# Patient Record
Sex: Male | Born: 1985 | Hispanic: Yes | Marital: Single | Smoking: Never smoker
Health system: Southern US, Community
[De-identification: ages and names within clinical notes are randomized; demographics above are authoritative.]

## PROBLEM LIST (undated history)

## (undated) DIAGNOSIS — G8929 Other chronic pain: Secondary | ICD-10-CM

## (undated) DIAGNOSIS — A048 Other specified bacterial intestinal infections: Secondary | ICD-10-CM

## (undated) DIAGNOSIS — M25579 Pain in unspecified ankle and joints of unspecified foot: Secondary | ICD-10-CM

## (undated) DIAGNOSIS — R1013 Epigastric pain: Secondary | ICD-10-CM

## (undated) DIAGNOSIS — K8020 Calculus of gallbladder without cholecystitis without obstruction: Secondary | ICD-10-CM

## (undated) DIAGNOSIS — R1011 Right upper quadrant pain: Secondary | ICD-10-CM

## (undated) DIAGNOSIS — M25562 Pain in left knee: Secondary | ICD-10-CM

## (undated) HISTORY — DX: Right upper quadrant pain: R10.11

## (undated) HISTORY — DX: Pain in unspecified ankle and joints of unspecified foot: M25.579

## (undated) HISTORY — DX: Other chronic pain: G89.29

## (undated) HISTORY — DX: Other specified bacterial intestinal infections: A04.8

## (undated) HISTORY — DX: Calculus of gallbladder without cholecystitis without obstruction: K80.20

## (undated) HISTORY — DX: Epigastric pain: R10.13

## (undated) HISTORY — PX: NO PAST SURGERIES: SHX2092

## (undated) HISTORY — DX: Pain in left knee: M25.562

---

## 2013-02-28 DIAGNOSIS — M25579 Pain in unspecified ankle and joints of unspecified foot: Secondary | ICD-10-CM

## 2013-02-28 HISTORY — DX: Pain in unspecified ankle and joints of unspecified foot: M25.579

## 2013-03-05 DIAGNOSIS — K219 Gastro-esophageal reflux disease without esophagitis: Secondary | ICD-10-CM | POA: Insufficient documentation

## 2013-12-22 DIAGNOSIS — H6123 Impacted cerumen, bilateral: Secondary | ICD-10-CM | POA: Insufficient documentation

## 2014-04-27 DIAGNOSIS — A048 Other specified bacterial intestinal infections: Secondary | ICD-10-CM | POA: Insufficient documentation

## 2014-04-27 HISTORY — DX: Other specified bacterial intestinal infections: A04.8

## 2014-09-07 DIAGNOSIS — R1013 Epigastric pain: Secondary | ICD-10-CM

## 2014-09-07 DIAGNOSIS — G8929 Other chronic pain: Secondary | ICD-10-CM | POA: Insufficient documentation

## 2014-09-07 HISTORY — DX: Other chronic pain: G89.29

## 2014-09-21 DIAGNOSIS — M25562 Pain in left knee: Secondary | ICD-10-CM

## 2014-09-21 HISTORY — DX: Pain in left knee: M25.562

## 2016-10-01 DIAGNOSIS — K8020 Calculus of gallbladder without cholecystitis without obstruction: Secondary | ICD-10-CM | POA: Diagnosis not present

## 2016-10-01 DIAGNOSIS — R1011 Right upper quadrant pain: Secondary | ICD-10-CM | POA: Insufficient documentation

## 2016-10-01 LAB — CBC
HEMATOCRIT: 45.6 % (ref 40.0–52.0)
Hemoglobin: 15.9 g/dL (ref 13.0–18.0)
MCH: 29.7 pg (ref 26.0–34.0)
MCHC: 34.9 g/dL (ref 32.0–36.0)
MCV: 85.2 fL (ref 80.0–100.0)
Platelets: 192 10*3/uL (ref 150–440)
RBC: 5.35 MIL/uL (ref 4.40–5.90)
RDW: 14.1 % (ref 11.5–14.5)
WBC: 9.9 10*3/uL (ref 3.8–10.6)

## 2016-10-01 NOTE — ED Triage Notes (Signed)
Patient reports mid abdominal pain all day.  Denies nausea or vomiting.

## 2016-10-02 ENCOUNTER — Encounter: Payer: Self-pay | Admitting: Emergency Medicine

## 2016-10-02 ENCOUNTER — Emergency Department: Payer: BLUE CROSS/BLUE SHIELD

## 2016-10-02 ENCOUNTER — Emergency Department
Admission: EM | Admit: 2016-10-02 | Discharge: 2016-10-02 | Disposition: A | Discharge status: Home / Self Care | Payer: BLUE CROSS/BLUE SHIELD | Attending: Emergency Medicine | Admitting: Emergency Medicine

## 2016-10-02 DIAGNOSIS — R1011 Right upper quadrant pain: Secondary | ICD-10-CM | POA: Diagnosis not present

## 2016-10-02 DIAGNOSIS — K8020 Calculus of gallbladder without cholecystitis without obstruction: Secondary | ICD-10-CM

## 2016-10-02 DIAGNOSIS — R109 Unspecified abdominal pain: Secondary | ICD-10-CM

## 2016-10-02 LAB — URINALYSIS, COMPLETE (UACMP) WITH MICROSCOPIC
BILIRUBIN URINE: NEGATIVE
Glucose, UA: NEGATIVE mg/dL
HGB URINE DIPSTICK: NEGATIVE
KETONES UR: NEGATIVE mg/dL
LEUKOCYTES UA: NEGATIVE
Nitrite: NEGATIVE
PH: 6 (ref 5.0–8.0)
Protein, ur: NEGATIVE mg/dL
SPECIFIC GRAVITY, URINE: 1.024 (ref 1.005–1.030)

## 2016-10-02 LAB — COMPREHENSIVE METABOLIC PANEL
ALT: 27 U/L (ref 17–63)
ANION GAP: 7 (ref 5–15)
AST: 23 U/L (ref 15–41)
Albumin: 4.5 g/dL (ref 3.5–5.0)
Alkaline Phosphatase: 50 U/L (ref 38–126)
BUN: 13 mg/dL (ref 6–20)
CHLORIDE: 108 mmol/L (ref 101–111)
CO2: 26 mmol/L (ref 22–32)
Calcium: 9.2 mg/dL (ref 8.9–10.3)
Creatinine, Ser: 1.04 mg/dL (ref 0.61–1.24)
GFR calc Af Amer: >60 mL/min (ref >60)
GFR calc non Af Amer: >60 mL/min (ref >60)
GLUCOSE: 117 mg/dL — AB (ref 65–99)
POTASSIUM: 3.1 mmol/L — AB (ref 3.5–5.1)
SODIUM: 141 mmol/L (ref 135–145)
Total Bilirubin: 0.7 mg/dL (ref 0.3–1.2)
Total Protein: 7.5 g/dL (ref 6.5–8.1)

## 2016-10-02 LAB — LIPASE, BLOOD: LIPASE: 31 U/L (ref 11–51)

## 2016-10-02 MED ORDER — IBUPROFEN 800 MG PO TABS: 800.0000 mg | ORAL_TABLET | Freq: Three times a day (TID) | ORAL | 0 refills | Status: DC | PRN

## 2016-10-02 MED ORDER — MORPHINE SULFATE (PF) 4 MG/ML IV SOLN
4.0000 mg | Freq: Once | INTRAVENOUS | Status: AC
  Administered 2016-10-02: 4 mg via INTRAVENOUS
  Filled 2016-10-02: qty 1

## 2016-10-02 MED ORDER — ONDANSETRON 4 MG PO TBDP: 4.0000 mg | ORAL_TABLET | Freq: Three times a day (TID) | ORAL | 0 refills | Status: DC | PRN

## 2016-10-02 MED ORDER — ONDANSETRON HCL 4 MG/2ML IJ SOLN
4.0000 mg | Freq: Once | INTRAMUSCULAR | Status: AC
  Administered 2016-10-02: 4 mg via INTRAVENOUS
  Filled 2016-10-02: qty 2

## 2016-10-02 NOTE — ED Notes (Signed)
Pt states that he has abdominal pain that started "yesterday evening". Family at the bedside.

## 2016-10-02 NOTE — ED Notes (Signed)
Pt given ginger ale, graham crackers and peanut butter. Pt instructed to alert nurse if pain returns with eating or drinking.

## 2016-10-02 NOTE — ED Provider Notes (Signed)
Care signed over from Dr. Zenda Alpers pending surgical evaluation. Dr. Tonita Cong evaluated the patient and feels the patient has biliary colic and not true cholecystitis. The patient was pain-free on his evaluation and he recommended a PO challenge.  The patient was able to eat without difficulty and he was in no pain. Dr. Tonita Cong has arranged a follow up appointment in 2 days 8/22 at 10:45am with Dr. Aleen Campi.  The patient is medically stable for outpatient management and strict return precautions are given.   Merrily Brittle, MD 10/02/16 [phone]

## 2016-10-02 NOTE — Discharge Instructions (Signed)
Please keep your appointment in 2 days to follow-up with your surgeon at 10:45 in the morning. Return to the emergency department sooner for any new or worsening symptoms such as fevers, chills, if you cannot eat or drink, for worsening pain, or for any other issues whatsoever.  It was a pleasure to take care of you today, and thank you for coming to our emergency department.  If you have any questions or concerns before leaving please ask the nurse to grab me and I'm more than happy to go through your aftercare instructions again.  If you were prescribed any opioid pain medication today such as Norco, Vicodin, Percocet, morphine, hydrocodone, or oxycodone please make sure you do not drive when you are taking this medication as it can alter your ability to drive safely.  If you have any concerns once you are home that you are not improving or are in fact getting worse before you can make it to your follow-up appointment, please do not hesitate to call 911 and come back for further evaluation.  Merrily Brittle, MD  Results for orders placed or performed during the hospital encounter of 10/02/16  Lipase, blood  Result Value Ref Range   Lipase 31 11 - 51 U/L  Comprehensive metabolic panel  Result Value Ref Range   Sodium 141 135 - 145 mmol/L   Potassium 3.1 (L) 3.5 - 5.1 mmol/L   Chloride 108 101 - 111 mmol/L   CO2 26 22 - 32 mmol/L   Glucose, Bld 117 (H) 65 - 99 mg/dL   BUN 13 6 - 20 mg/dL   Creatinine, Ser 1.61 0.61 - 1.24 mg/dL   Calcium 9.2 8.9 - 09.6 mg/dL   Total Protein 7.5 6.5 - 8.1 g/dL   Albumin 4.5 3.5 - 5.0 g/dL   AST 23 15 - 41 U/L   ALT 27 17 - 63 U/L   Alkaline Phosphatase 50 38 - 126 U/L   Total Bilirubin 0.7 0.3 - 1.2 mg/dL   GFR calc non Af Amer >60 >60 mL/min   GFR calc Af Amer >60 >60 mL/min   Anion gap 7 5 - 15  CBC  Result Value Ref Range   WBC 9.9 3.8 - 10.6 K/uL   RBC 5.35 4.40 - 5.90 MIL/uL   Hemoglobin 15.9 13.0 - 18.0 g/dL   HCT 04.5 40.9 - 81.1 %   MCV  85.2 80.0 - 100.0 fL   MCH 29.7 26.0 - 34.0 pg   MCHC 34.9 32.0 - 36.0 g/dL   RDW 91.4 78.2 - 95.6 %   Platelets 192 150 - 440 K/uL  Urinalysis, Complete w Microscopic  Result Value Ref Range   Color, Urine YELLOW (A) YELLOW   APPearance HAZY (A) CLEAR   Specific Gravity, Urine 1.024 1.005 - 1.030   pH 6.0 5.0 - 8.0   Glucose, UA NEGATIVE NEGATIVE mg/dL   Hgb urine dipstick NEGATIVE NEGATIVE   Bilirubin Urine NEGATIVE NEGATIVE   Ketones, ur NEGATIVE NEGATIVE mg/dL   Protein, ur NEGATIVE NEGATIVE mg/dL   Nitrite NEGATIVE NEGATIVE   Leukocytes, UA NEGATIVE NEGATIVE   RBC / HPF 6-30 0 - 5 RBC/hpf   WBC, UA 0-5 0 - 5 WBC/hpf   Bacteria, UA RARE (A) NONE SEEN   Squamous Epithelial / LPF 0-5 (A) NONE SEEN   Mucous PRESENT    Amorphous Crystal PRESENT    US Abdomen Limited Ruq  Result Date: 10/02/2016 CLINICAL DATA:  Abdominal pain. EXAM: ULTRASOUND ABDOMEN LIMITED  RIGHT UPPER QUADRANT COMPARISON:  No recent prior. FINDINGS: Gallbladder: Sludge is noted within the gallbladder. A 1.8 cm stone appears to be present in the neck of the gallbladder. Gallbladder wall is thickened at 6 mm. Findings suggest cholecystitis. Negative Murphy sign. Common bile duct: Diameter: 4.1 mm Liver: No focal lesion identified. Within normal limits in parenchymal echogenicity. Portal vein is patent on color Doppler imaging with normal direction of blood flow towards the liver. IMPRESSION: 1. 1.8 cm stone appears be present in the neck of the gallbladder. Large amount of gallbladder sludge noted. Gallbladder wall is thickened at 6 mm suggesting cholecystitis. 2.  No biliary distention.  The liver appears unremarkable. Electronically Signed   By: Maisie Fus  Register   On: 10/02/2016 07:18

## 2016-10-02 NOTE — Consult Note (Signed)
Patient ID: Randall Thomas, male   DOB: November 11, 1985, 31 y.o.   MRN: [id number]  CC: Abdominal pain  HPI Randall Thomas is a 31 y.o. male presented to the ER today for evaluation of abdominal pain. Pain started last night after eating. He states that eating made it worse. It started approximately 2 hours after he ate. He denies any fevers, chills, nausea, vomiting, chest pain, shortness of breath. He denies any diarrhea but states he thinks he's had some constipation. He's never had pain like this before. As described as a stabbing sensation into his right upper quadrant. Patient reports of the time of my consultation he been pain-free for nearly 3 hours.  HPI  History reviewed. No pertinent past medical history. Currently on no medications  History reviewed. No pertinent surgical history. He has never had surgery  Family history: Patient denies any known family history of cancer, diabetes, heart disease.  Social History Social History  Substance Use Topics  . Smoking status: Never Smoker  . Smokeless tobacco: Not on file  . Alcohol use Yes    No Known Allergies  No current facility-administered medications for this encounter.    Current Outpatient Prescriptions  Medication Sig Dispense Refill  . ibuprofen (ADVIL,MOTRIN) 800 MG tablet Take 1 tablet (800 mg total) by mouth every 8 (eight) hours as needed. 30 tablet 0  . ondansetron (ZOFRAN ODT) 4 MG disintegrating tablet Take 1 tablet (4 mg total) by mouth every 8 (eight) hours as needed for nausea or vomiting. 20 tablet 0     Review of Systems A Multi-point review of systems was asked and was negative except for the findings documented in the history of present illness  Physical Exam Blood pressure 124/80, pulse 64, temperature 98 F (36.7 C), temperature source Oral, resp. rate 17, weight 86.2 kg (190 lb), SpO2 99 %. CONSTITUTIONAL: No acute distress. EYES: Pupils are equal, round, and reactive to  light, Sclera are non-icteric. EARS, NOSE, MOUTH AND THROAT: The oropharynx is clear. The oral mucosa is pink and moist. Hearing is intact to voice. LYMPH NODES:  Lymph nodes in the neck are normal. RESPIRATORY:  Lungs are clear. There is normal respiratory effort, with equal breath sounds bilaterally, and without pathologic use of accessory muscles. CARDIOVASCULAR: Heart is regular without murmurs, gallops, or rubs. GI: The abdomen is soft, nontender, and nondistended. There are no palpable masses. There is no hepatosplenomegaly. There are normal bowel sounds in all quadrants. GU: Rectal deferred.   MUSCULOSKELETAL: Normal muscle strength and tone. No cyanosis or edema.   SKIN: Turgor is good and there are no pathologic skin lesions or ulcers. NEUROLOGIC: Motor and sensation is grossly normal. Cranial nerves are grossly intact. PSYCH:  Oriented to person, place and time. Affect is normal.  Data Reviewed Images and labs reviewed. Labs are all within normal limits with a white blood count 9.9, normal LFTs with a alkaline phosphatase of 50, AST of 23, ALT 27, total bilirubin 0.7. Right upper quadrant ultrasound shows cholelithiasis and gallbladder wall thickening but without pericholecystic fluid or ductal dilatation. I have personally reviewed the patient's imaging, laboratory findings and medical records.    Assessment    Abdominal pain, possible cholecystitis    Plan    31 year old male with cholelithiasis and ultrasound findings concerning for cholecystitis. At the time of surgical consultation the patient was completely pain free. Described as options of inpatient versus outpatient management. Given that he's pain-free I recommended outpatient management if he  can pass a by mouth challenge. Discussed signs and symptoms of recurrent cholecystitis and return to the emergency room or clinic immediately should they occur. Otherwise if he passes his by mouth challenge he will follow-up in clinic  on Wednesday morning at 10:45 AM with my partners.     Time spent with the patient was 40 minutes, with more than 50% of the time spent in face-to-face education, counseling and care coordination.     Ricarda Frame, MD FACS General Surgeon 10/02/2016, 9:42 AM

## 2016-10-02 NOTE — ED Provider Notes (Signed)
[facility] Emergency Department Provider Note   ____________________________________________   First MD Initiated Contact with Patient 10/02/16 [phone]     (approximate)  I have reviewed the triage vital signs and the nursing notes.   HISTORY  Chief Complaint Abdominal Pain    HPI Randall Thomas is a 31 y.o. male who comes into the hospital today with some abdominal pain. He reports that it started last night. He wasn't doing anything prior to the pain. He states that eating made it worse. The pain seemed to start around 2 hours after he ate. The patient denies any nausea or vomiting and denies any diarrhea. He has had some constipation. The patient's last bowel movement was yesterday. He denies ever having this pain before not in this way. The patient's pain is currently a 6 out of 10 in intensity. It was worse when he arrived. The patient is here today for evaluation of his symptoms.   History reviewed. No pertinent past medical history.  There are no active problems to display for this patient.   History reviewed. No pertinent surgical history.  Prior to Admission medications   Not on File    Allergies Patient has no known allergies.  No family history on file.  Social History Social History  Substance Use Topics  . Smoking status: Never Smoker  . Smokeless tobacco: Not on file  . Alcohol use Yes    Review of Systems  Constitutional: No fever/chills Eyes: No visual changes. ENT: No sore throat. Cardiovascular: Denies chest pain. Respiratory: Denies shortness of breath. Gastrointestinal:  abdominal pain.  No nausea, no vomiting.  No diarrhea.  No constipation. Genitourinary: Negative for dysuria. Musculoskeletal: Negative for back pain. Skin: Negative for rash. Neurological: Negative for headaches, focal weakness or numbness.   ____________________________________________   PHYSICAL EXAM:  VITAL SIGNS: ED  Triage Vitals  Enc Vitals Group     BP 10/01/16 2347 (!) 163/93     Pulse Rate 10/01/16 2347 81     Resp 10/01/16 2347 20     Temp 10/01/16 2347 98 F (36.7 C)     Temp Source 10/01/16 2347 Oral     SpO2 10/01/16 2347 100 %     Weight 10/01/16 2343 190 lb (86.2 kg)     Height --      Head Circumference --      Peak Flow --      Pain Score 10/01/16 2343 8     Pain Loc --      Pain Edu? --      Excl. in GC? --     Constitutional: Alert and oriented. Well appearing and in Mild distress. Eyes: Conjunctivae are normal. PERRL. EOMI. Head: Atraumatic. Nose: No congestion/rhinnorhea. Mouth/Throat: Mucous membranes are moist.  Oropharynx non-erythematous. Cardiovascular: Normal rate, regular rhythm. Grossly normal heart sounds.  Good peripheral circulation. Respiratory: Normal respiratory effort.  No retractions. Lungs CTAB. Gastrointestinal: Soft With right upper quadrant abdominal pain to palpation No distention. Positive bowel sounds Musculoskeletal: No lower extremity tenderness nor edema.   Neurologic:  Normal speech and language.  Skin:  Skin is warm, dry and intact.  Psychiatric: Mood and affect are normal.   ____________________________________________   LABS (all labs ordered are listed, but only abnormal results are displayed)  Labs Reviewed  COMPREHENSIVE METABOLIC PANEL - Abnormal; Notable for the following:       Result Value   Potassium 3.1 (*)    Glucose, Bld 117 (*)  All other components within normal limits  URINALYSIS, COMPLETE (UACMP) WITH MICROSCOPIC - Abnormal; Notable for the following:    Color, Urine YELLOW (*)    APPearance HAZY (*)    Bacteria, UA RARE (*)    Squamous Epithelial / LPF 0-5 (*)    All other components within normal limits  LIPASE, BLOOD  CBC   ____________________________________________  EKG  none ____________________________________________  RADIOLOGY  US Abdomen Limited Ruq  Result Date: 10/02/2016 CLINICAL DATA:   Abdominal pain. EXAM: ULTRASOUND ABDOMEN LIMITED RIGHT UPPER QUADRANT COMPARISON:  No recent prior. FINDINGS: Gallbladder: Sludge is noted within the gallbladder. A 1.8 cm stone appears to be present in the neck of the gallbladder. Gallbladder wall is thickened at 6 mm. Findings suggest cholecystitis. Negative Murphy sign. Common bile duct: Diameter: 4.1 mm Liver: No focal lesion identified. Within normal limits in parenchymal echogenicity. Portal vein is patent on color Doppler imaging with normal direction of blood flow towards the liver. IMPRESSION: 1. 1.8 cm stone appears be present in the neck of the gallbladder. Large amount of gallbladder sludge noted. Gallbladder wall is thickened at 6 mm suggesting cholecystitis. 2.  No biliary distention.  The liver appears unremarkable. Electronically Signed   By: Maisie Fus  Register   On: 10/02/2016 07:18    ____________________________________________   PROCEDURES  Procedure(s) performed: None  Procedures  Critical Care performed: No  ____________________________________________   INITIAL IMPRESSION / ASSESSMENT AND PLAN / ED COURSE  Pertinent labs & imaging results that were available during my care of the patient were reviewed by me and considered in my medical decision making (see chart for details).  This is a 31 year old male who comes into the hospital today with some right upper quadrant pain. The patient's blood work was unremarkable and it did take some time for him to receive his ultrasound. He received a dose of morphine and Zofran for his pain. Once he received his ultrasound result showed that he had a 1.8 cm stone stuck in his gallbladder neck with some sludge and some gallbladder wall thickening. I did contact the surgeon Dr. Tonita Cong to evaluate the patient's ultrasound and to admit the patient to the surgical service.      ____________________________________________   FINAL CLINICAL IMPRESSION(S) / ED DIAGNOSES  Final  diagnoses:  Abdominal pain  Cholecystitis  Gallstones      NEW MEDICATIONS STARTED DURING THIS VISIT:  New Prescriptions   No medications on file     Note:  This document was prepared using Dragon voice recognition software and may include unintentional dictation errors.    Rebecka Apley, MD 10/02/16 [phone]

## 2016-10-02 NOTE — ED Notes (Signed)
Pt reports pain has not returned since eating graham crackers with peanut butter and drinking ginger ale.

## 2016-10-04 ENCOUNTER — Encounter: Payer: Self-pay | Admitting: Surgery

## 2016-10-04 ENCOUNTER — Ambulatory Visit (INDEPENDENT_AMBULATORY_CARE_PROVIDER_SITE_OTHER): Payer: BLUE CROSS/BLUE SHIELD | Admitting: Surgery

## 2016-10-04 VITALS — BP 131/77 | HR 69 | Temp 98.5°F | Ht 70.0 in | Wt 190.0 lb

## 2016-10-04 DIAGNOSIS — K8020 Calculus of gallbladder without cholecystitis without obstruction: Secondary | ICD-10-CM | POA: Diagnosis not present

## 2016-10-04 DIAGNOSIS — K219 Gastro-esophageal reflux disease without esophagitis: Secondary | ICD-10-CM | POA: Diagnosis not present

## 2016-10-04 MED ORDER — OMEPRAZOLE 40 MG PO CPDR: 40.0000 mg | DELAYED_RELEASE_CAPSULE | Freq: Every day | ORAL | 2 refills | Status: DC

## 2016-10-04 NOTE — Progress Notes (Signed)
10/04/2016  History of Present Illness: Randall Thomas is a 31 y.o. male who presents as follow up from the ED on 8/20.  He had presented with abdominal pain and had an ultrasound with cholelithiasis and mild gallbladder wall thickening. His pain resolved on its own in the ED and he passed an oral challenge and was discharged to home.  He denies any symptoms since his discharge.    He reports that this past episode started after eating tacos.  He believes he has had two prior episodes in the past.  However, he also has a history of gastritis had positive H pylori test in 2016 and was treated with triple therapy.  He reports having reflux symptoms with heartburn and sensation of burning going up his chest to his neck.  He currently does not take any medications for this.  Denies any fevers/chills, nausea or vomiting.  Past Medical History: Past Medical History:  Diagnosis Date  . Acute pain of left knee 09/21/2014  . Ankle pain 02/28/2013  . Chronic epigastric pain 09/07/2014  . Gallstones   . H. pylori infection 04/27/2014  . Right upper quadrant abdominal pain      Past Surgical History: Past Surgical History:  Procedure Laterality Date  . NO PAST SURGERIES      Home Medications: Prior to Admission medications   Medication Sig Start Date End Date Taking? Authorizing Provider  omeprazole (PRILOSEC) 40 MG capsule Take 1 capsule (40 mg total) by mouth daily. 10/04/16   Henrene Dodge, MD    Allergies: No Known Allergies  Review of Systems: Review of Systems  Constitutional: Negative for chills and fever.  Respiratory: Negative for shortness of breath.   Cardiovascular: Negative for chest pain.  Gastrointestinal: Positive for abdominal pain and heartburn. Negative for constipation, diarrhea, nausea and vomiting.    Physical Exam BP 131/77   Pulse 69   Temp 98.5 F (36.9 C) (Oral)   Ht 5\' 10"  (1.778 m)   Wt 86.2 kg (190 lb)   BMI 27.26 kg/m  CONSTITUTIONAL: No  acute distress HEENT:  Normocephalic, atraumatic, extraocular motion intact. RESPIRATORY:  Lungs are clear, and breath sounds are equal bilaterally. Normal respiratory effort without pathologic use of accessory muscles. CARDIOVASCULAR: Heart is regular without murmurs, gallops, or rubs. GI: The abdomen is soft, nondistended, with mild discomfort to palpation in the right upper quadrant.  He has some worse discomfort in the epigastric area, particularly at the subxyphoid region.  There is also mild discomfort in the left upper quadrant.  NEUROLOGIC:  Motor and sensation is grossly normal.  Cranial nerves are grossly intact. PSYCH:  Alert and oriented to person, place and time. Affect is normal.  Labs/Imaging: On 8/19, patient had normal WBC of 9.9, and normal LFTs.  His ultrasound showed cholelithiasis with 1.8 cm stone near the neck of the gallbladder, with only mild gallbladder wall thickening to 6 mm, without any pericholecystic fluid, and a negative Murphy's sign.  Assessment and Plan: This is a 31 y.o. male who presents with cholelithiasis, and also confounding diagnosis of acid reflux which is currently untreated.  Discussed with the patient that given his symptoms and other diagnoses, it would be prudent to treat this acid reflux first to further elucidate the symptoms and establish better a biliary etiology.  Will start him today on Prilosec 40 mg once daily.  Will also give him information about low fat diet for conservative management of his cholelithiasis.  Will follow up with [place]  in 1 month to evaluate his progress and symptoms.  Face-to-face time spent with the patient and care providers was 25 minutes, with more than 50% of the time spent counseling, educating, and coordinating care of the patient.     [name], MD [facility]

## 2016-10-04 NOTE — Patient Instructions (Signed)
Dieta con bajo contenido de grasas para la pancreatitis o los trastornos de la vescula  (Low-Fat Diet for Pancreatitis or Gallbladder Conditions)  Una dieta con bajo contenido de grasas puede ser til si usted tiene pancreatitis o trastornos de la vescula. En estos trastornos, el pncreas y la vescula tienen dificultades para digerir las grasas. Un plan de alimentacin saludable con menos grasas ayudar a que el pncreas y la vescula descansen, y reducir los sntomas.  QU DEBO SABER ACERCA DE ESTA DIETA?   Consuma una dieta con bajo contenido de grasas.  ? Reduzca el consumo de grasas a menos del 20% al 30% del total de caloras diarias. Esto representa menos de 50a 60gramos de grasas por da.  ? Recuerde que la dieta debe incluir algo de grasa. Consulte al nutricionista cul debe ser su meta diaria.  ? Elija alimentos saludables sin grasas o con bajo contenido de grasas. Busque las palabras "sin grasa", "bajo en grasas" o "bajo contenido de grasas".  ? Como gua, mire la etiqueta y elija alimentos con menos de 3gramos de grasa por porcin. Coma solo una porcin.   Evite el alcohol.   No fumar. Si necesita ayuda para dejar de fumar, hable con el mdico.   Haga comidas pequeas y frecuentes en lugar de 3 comidas abundantes y pesadas.    QU ALIMENTOS PUEDO COMER?  Cereales  Incluya granos y almidones saludables, como papas, pan integral, cereales ricos en fibras y arroz integral. Elija opciones de cereales integrales siempre que sea posible. En los adultos, los cereales integrales deben representar del 45% al 65% de las caloras diarias.  Frutas y verduras  Coma muchas frutas y verduras. Las frutas y verduras frescas aportan fibra a la dieta.  Carnes y otras fuentes de protenas  Coma carnes magras, como pollo y cerdo. Quite la grasa de la carne antes de cocinarla. Los huevos, el pescado y los frijoles son otras fuentes de protenas. En los adultos, estos alimentos deben representar entre el 10% y  el 35% de las caloras diarias.  Lcteos  Elija leche y otros productos lcteos con bajo contenido de grasas. Los lcteos aportan grasas y protenas, adems de calcio.  Grasas y aceites  Limite los alimentos con alto contenido de grasas, como las comidas fritas, los dulces, los productos horneados y las bebidas azucaradas.  Otros  Los condimentos y las salsas cremosas, como la mayonesa, pueden aportar grasa adicional. Piense si necesita usarlos o no, o use pequeas cantidades u opciones con bajo contenido de grasas.  QU ALIMENTOS NO ESTN RECOMENDADOS?   Los alimentos con alto contenido de grasas, por ejemplo:  ? Alimentos horneados.  ? Helados.  ? Tostadas francesas.  ? Panecillos dulces.  ? Pizza.  ? Pan de queso.  ? Alimentos rebozados o cubiertos con mantequilla, salsas cremosas o queso.  ? Comidas fritas.  ? Postres y bebidas azucaradas.   Alimentos que causan gases o meteorismo    Esta informacin no tiene como fin reemplazar el consejo del mdico. Asegrese de hacerle al mdico cualquier pregunta que tenga.  Document Released: 02/04/2013 Document Revised: 02/04/2013 Document Reviewed: 01/13/2013  [employer]vier Interactive Patient Education  2017 [employer]

## 2016-11-06 ENCOUNTER — Encounter: Payer: Self-pay | Admitting: Surgery

## 2016-11-06 ENCOUNTER — Ambulatory Visit (INDEPENDENT_AMBULATORY_CARE_PROVIDER_SITE_OTHER): Payer: BLUE CROSS/BLUE SHIELD | Admitting: Surgery

## 2016-11-06 VITALS — BP 123/81 | HR 66 | Temp 98.1°F | Wt 188.0 lb

## 2016-11-06 DIAGNOSIS — K8020 Calculus of gallbladder without cholecystitis without obstruction: Secondary | ICD-10-CM

## 2016-11-06 DIAGNOSIS — K219 Gastro-esophageal reflux disease without esophagitis: Secondary | ICD-10-CM | POA: Diagnosis not present

## 2016-11-06 NOTE — Progress Notes (Signed)
11/06/2016  History of Present Illness: Randall Thomas is a 31 y.o. male who presents for follow up of cholelithiasis and GERD.  He reports that he barely has any right upper quadrant pain and when he does, it's very short lived and not severe.  He does, however, still have acid reflux symptoms, with heartburn and sensation of reflux going up his chest.  He reports that he is unable to lay flat to sleep and sleeps on his side instead.  He also reports some shortness of breath when the reflux symptoms are bad.  He has been taking the Prilosec prescribed, but this has not helped much.  Otherwise, he is trying to do a low fat diet, though is not compliant with it all the time.  He denies any nausea or vomiting, denies any fevers or chills.  Past Medical History: Past Medical History:  Diagnosis Date  . Acute pain of left knee 09/21/2014  . Ankle pain 02/28/2013  . Chronic epigastric pain 09/07/2014  . Gallstones   . H. pylori infection 04/27/2014  . Right upper quadrant abdominal pain      Past Surgical History: Past Surgical History:  Procedure Laterality Date  . NO PAST SURGERIES      Home Medications: Prior to Admission medications   Medication Sig Start Date End Date Taking? Authorizing Provider  omeprazole (PRILOSEC) 40 MG capsule Take 1 capsule (40 mg total) by mouth daily. 10/04/16   Henrene Dodge, MD    Allergies: No Known Allergies   Review of Systems: Review of Systems  Constitutional: Negative for chills and fever.  Respiratory: Positive for shortness of breath (with reflux).   Cardiovascular: Negative for chest pain.  Gastrointestinal: Positive for abdominal pain (epigastric). Negative for nausea and vomiting.    Physical Exam BP 123/81   Pulse 66   Temp 98.1 F (36.7 C) (Oral)   Wt 85.3 kg (188 lb)   BMI 26.98 kg/m  CONSTITUTIONAL: No acute distress RESPIRATORY:  Lungs are clear, and breath sounds are equal bilaterally. Normal respiratory effort  without pathologic use of accessory muscles. CARDIOVASCULAR: Heart is regular without murmurs, gallops, or rubs. GI: The abdomen is soft, nondistended, with mild discomfort to palpation in the epigastric area.  No other areas of tenderness.  Negative Murphy's sign. There were no palpable masses.  NEUROLOGIC:  Motor and sensation is grossly normal.  Cranial nerves are grossly intact. PSYCH:  Alert and oriented to person, place and time. Affect is normal.  Labs/Imaging: None since prior visit.  Assessment and Plan: This is a 31 y.o. male who presents as a follow up for cholelithiasis and GERD.  His cholelithiasis has not given him much symptoms and when it does, the symptoms are very short-lived and not as severe.  However, his GERD does cause him trouble.  Discussed with the patient that since his symptoms from the cholelithiasis are manageable and only mild, there is no urgency to doing cholecystectomy.  It seems that he is able to control symptoms well with some diet modification.  However, his GERD symptoms are not being well controlled.  He does have a history of H pylori in the past.  It would be wise to refer him to Gastroenterology for evaluation and possible EGD.  We will send the referral.  From the surgical standpoint, there is no acute surgical need and we can proceed with watchful waiting.  He understands that if his cholelithiasis gets worse, he can call us to set up an  appointment so we can discuss surgery.  Otherwise, follow up on an as needed basis.  Face-to-face time spent with the patient and care providers was 15 minutes, with more than 50% of the time spent counseling, educating, and coordinating care of the patient.     [name], MD [facility]

## 2016-11-06 NOTE — Patient Instructions (Signed)
Lo vamos a referir a ver al Gastroenterologo para que le trate sus sintomas.

## 2016-12-11 ENCOUNTER — Ambulatory Visit: Payer: BLUE CROSS/BLUE SHIELD | Admitting: Gastroenterology

## 2018-04-13 IMAGING — US US ABDOMEN LIMITED
1 series · 14 of 25 positions shown · non-contrast
Comparison: No recent prior.

CLINICAL DATA: Abdominal pain.

EXAM:
ULTRASOUND ABDOMEN LIMITED RIGHT UPPER QUADRANT

[Series 1: us abdomen limited · 0.30mm/px · 14 of 40 slices shown]
[im 1/40]
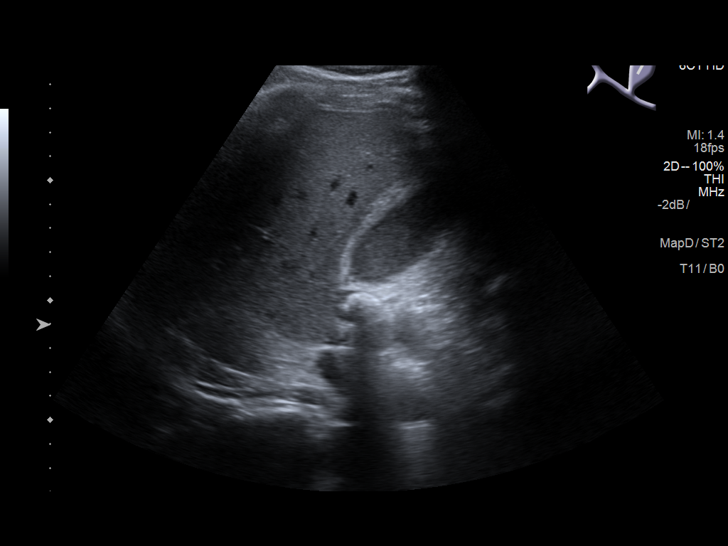
[im 4/40]
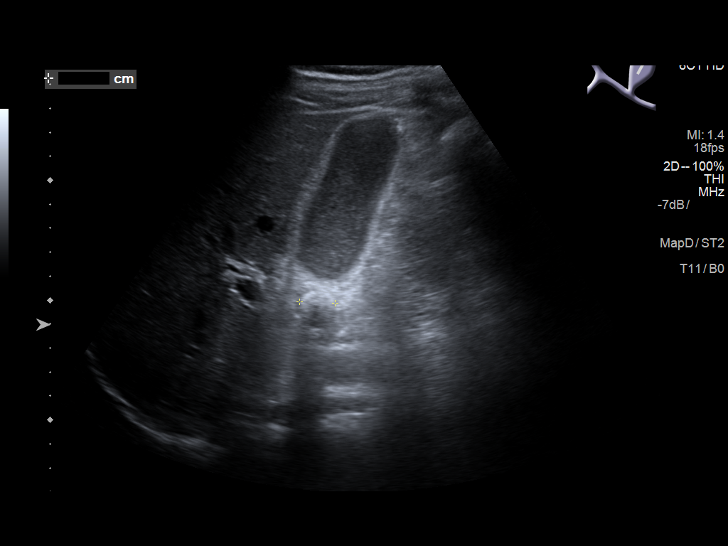
[im 7/40]
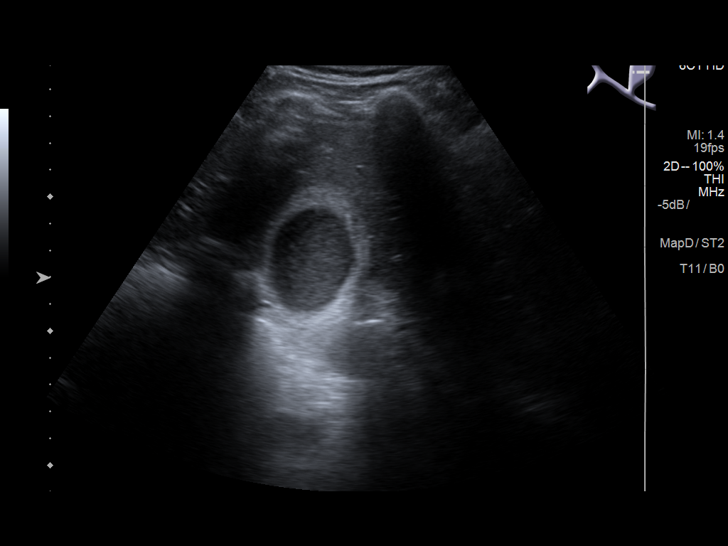
[im 10/40]
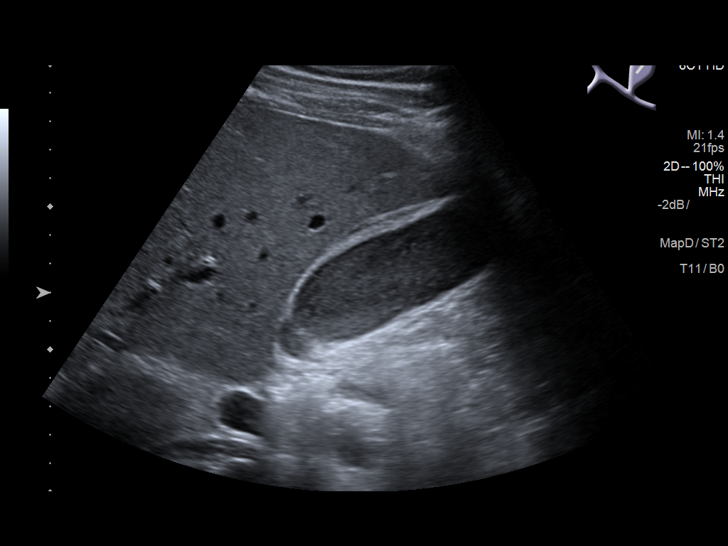
[im 14/40]
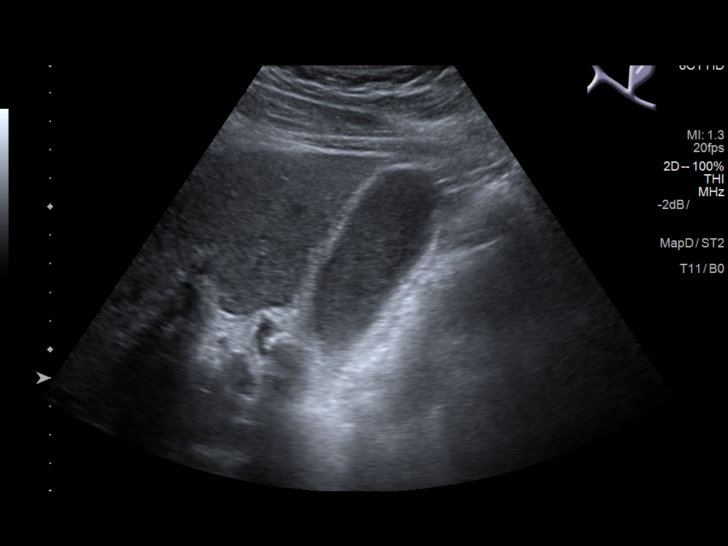
[im 15/40]
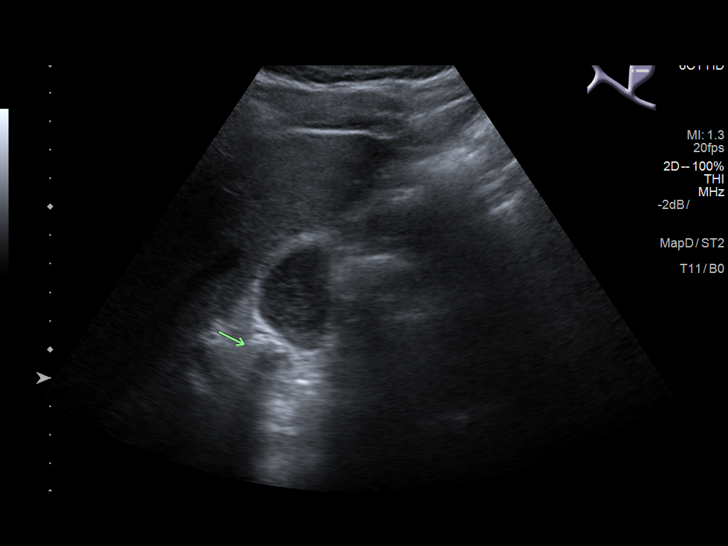
[im 18/40]
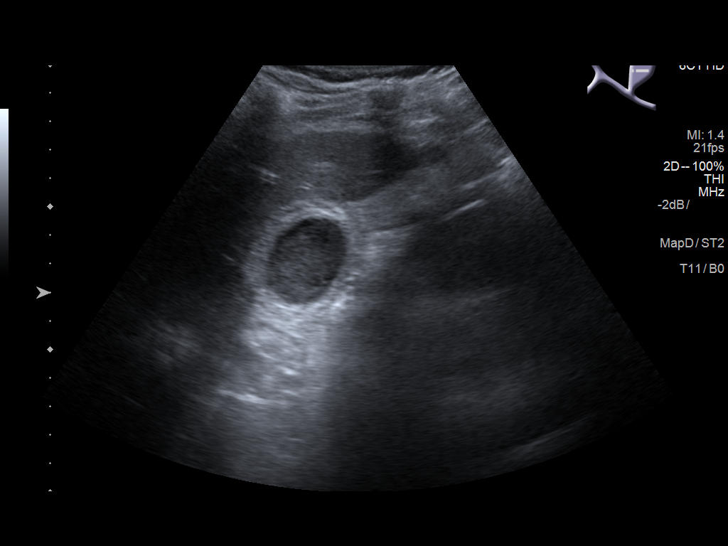
[im 22/40]
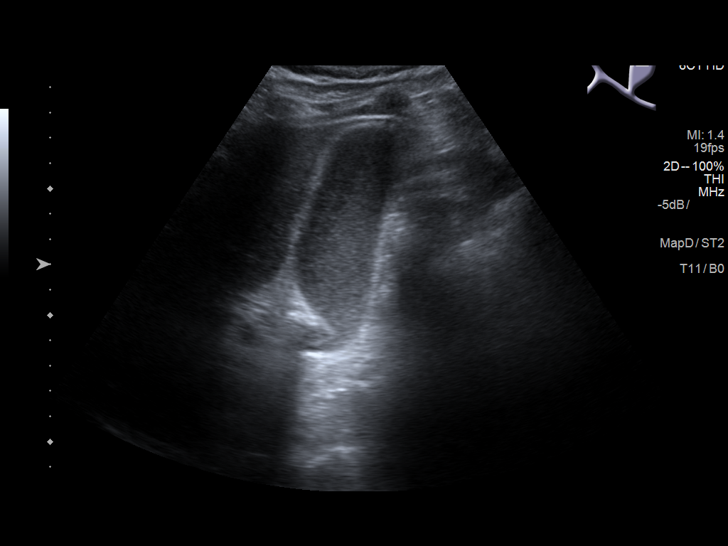
[im 25/40]
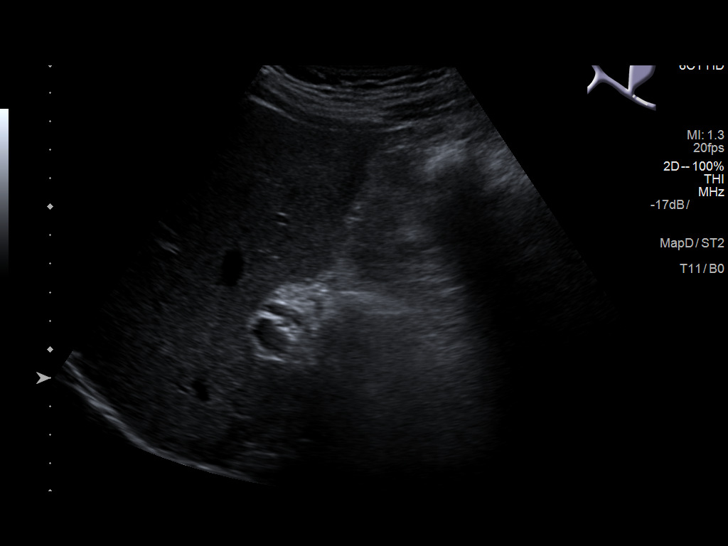
[im 27/40]
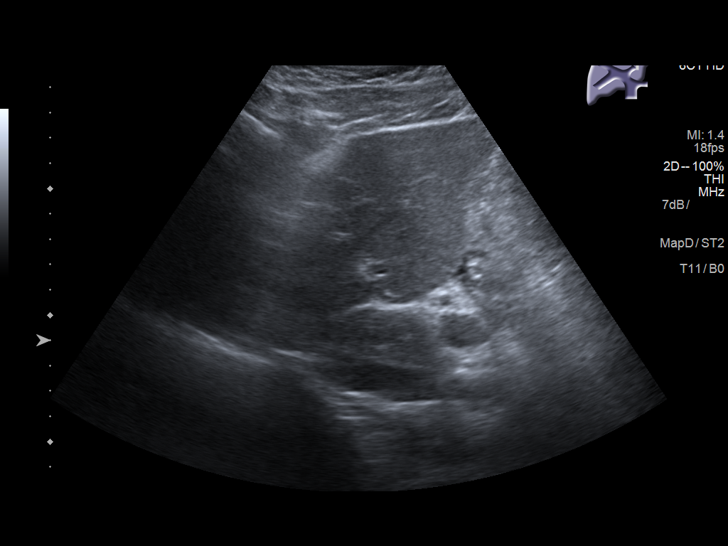
[im 30/40]
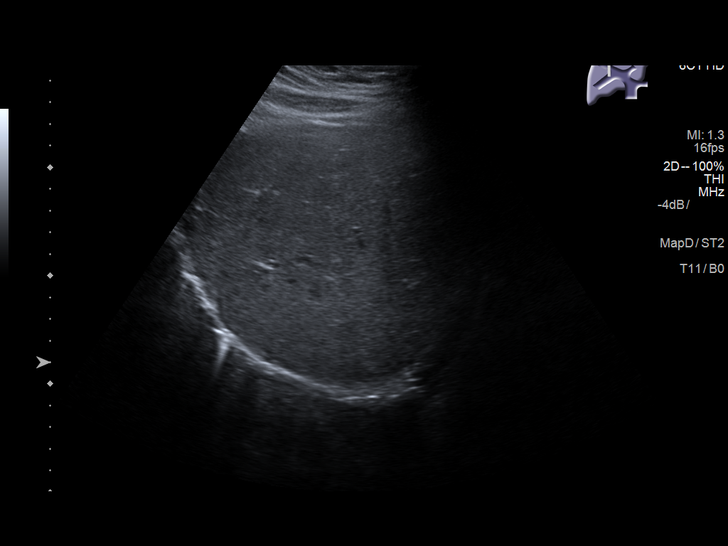
[im 33/40]
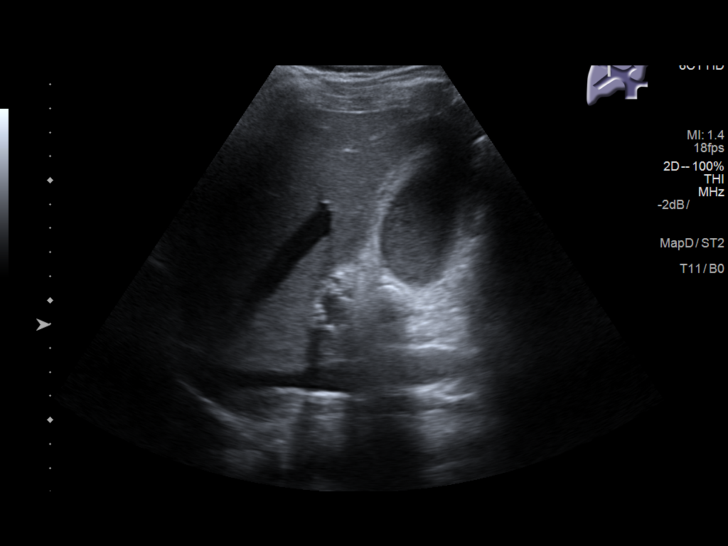
[im 36/40]
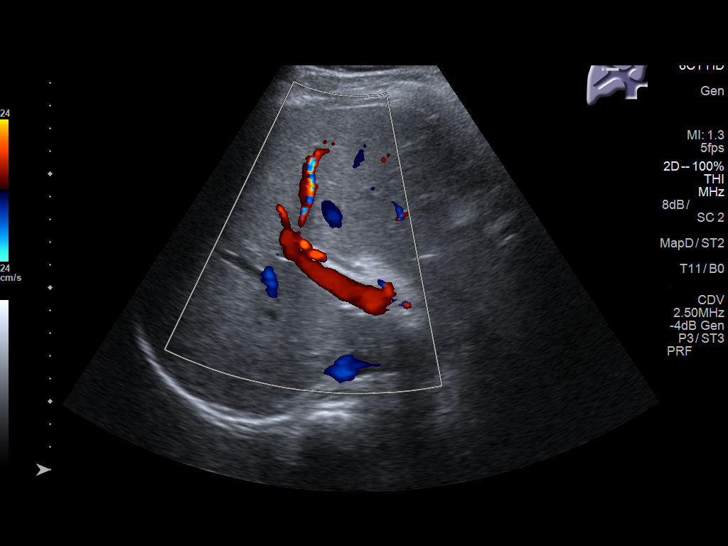
[im 40/40]
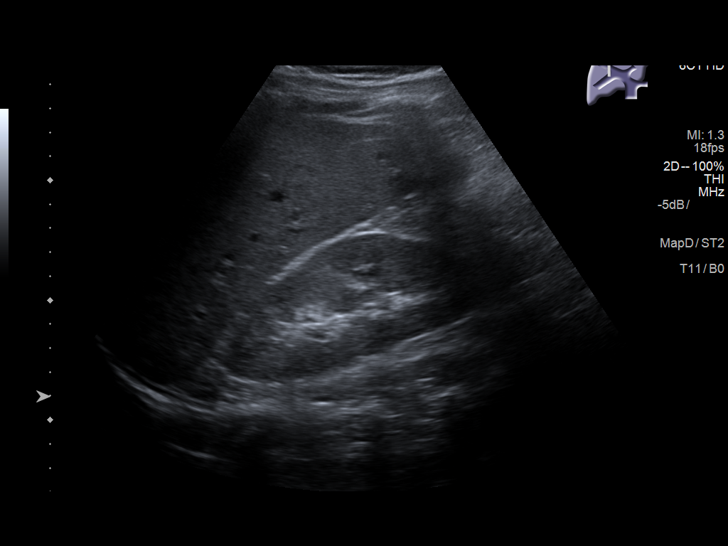

[14 of 25 positions shown; findings below may reference images not displayed]

FINDINGS: Gallbladder:

Sludge is noted within the gallbladder. A 1.8 cm stone appears to be
present in the neck of the gallbladder. Gallbladder wall is
thickened at 6 mm. Findings suggest cholecystitis. Negative Murphy
sign.

Common bile duct:

Diameter: 4.1 mm

Liver:

No focal lesion identified. Within normal limits in parenchymal
echogenicity. Portal vein is patent on color Doppler imaging with
normal direction of blood flow towards the liver.
IMPRESSION: 1. 1.8 cm stone appears be present in the neck of the gallbladder.
Large amount of gallbladder sludge noted. Gallbladder wall is
thickened at 6 mm suggesting cholecystitis.

2.  No biliary distention.  The liver appears unremarkable.

## 2020-12-07 ENCOUNTER — Other Ambulatory Visit: Payer: Self-pay

## 2020-12-07 DIAGNOSIS — K219 Gastro-esophageal reflux disease without esophagitis: Secondary | ICD-10-CM | POA: Insufficient documentation

## 2020-12-07 DIAGNOSIS — R109 Unspecified abdominal pain: Secondary | ICD-10-CM | POA: Diagnosis present

## 2020-12-07 DIAGNOSIS — K8020 Calculus of gallbladder without cholecystitis without obstruction: Secondary | ICD-10-CM | POA: Diagnosis not present

## 2020-12-07 LAB — COMPREHENSIVE METABOLIC PANEL
ALT: 28 U/L (ref 0–44)
AST: 19 U/L (ref 15–41)
Albumin: 4.5 g/dL (ref 3.5–5.0)
Alkaline Phosphatase: 57 U/L (ref 38–126)
Anion gap: 7 (ref 5–15)
BUN: 13 mg/dL (ref 6–20)
CO2: 29 mmol/L (ref 22–32)
Calcium: 9 mg/dL (ref 8.9–10.3)
Chloride: 101 mmol/L (ref 98–111)
Creatinine, Ser: 0.78 mg/dL (ref 0.61–1.24)
GFR, Estimated: >60 mL/min (ref >60)
Glucose, Bld: 101 mg/dL — ABNORMAL HIGH (ref 70–99)
Potassium: 3.8 mmol/L (ref 3.5–5.1)
Sodium: 137 mmol/L (ref 135–145)
Total Bilirubin: 1 mg/dL (ref 0.3–1.2)
Total Protein: 8.3 g/dL — ABNORMAL HIGH (ref 6.5–8.1)

## 2020-12-07 LAB — CBC
HCT: 45.4 % (ref 39.0–52.0)
Hemoglobin: 16 g/dL (ref 13.0–17.0)
MCH: 29.9 pg (ref 26.0–34.0)
MCHC: 35.2 g/dL (ref 30.0–36.0)
MCV: 84.7 fL (ref 80.0–100.0)
Platelets: 171 10*3/uL (ref 150–400)
RBC: 5.36 MIL/uL (ref 4.22–5.81)
RDW: 13.3 % (ref 11.5–15.5)
WBC: 11.6 10*3/uL — ABNORMAL HIGH (ref 4.0–10.5)
nRBC: 0 % (ref 0.0–0.2)

## 2020-12-07 LAB — LIPASE, BLOOD: Lipase: 31 U/L (ref 11–51)

## 2020-12-07 NOTE — ED Triage Notes (Signed)
Pt to ED for left sided mid abd pain that started a few days ago. Denies n/v/d.

## 2020-12-08 ENCOUNTER — Emergency Department
Admission: EM | Admit: 2020-12-08 | Discharge: 2020-12-08 | Disposition: A | Discharge status: Home / Self Care | Payer: 59 | Attending: Emergency Medicine | Admitting: Emergency Medicine

## 2020-12-08 ENCOUNTER — Emergency Department: Payer: 59

## 2020-12-08 DIAGNOSIS — K8050 Calculus of bile duct without cholangitis or cholecystitis without obstruction: Secondary | ICD-10-CM

## 2020-12-08 MED ORDER — DICYCLOMINE HCL 10 MG PO CAPS: 10.0000 mg | ORAL_CAPSULE | Freq: Three times a day (TID) | ORAL | 0 refills | Status: AC | PRN

## 2020-12-08 MED ORDER — SUCRALFATE 1 G PO TABS: 1.0000 g | ORAL_TABLET | Freq: Four times a day (QID) | ORAL | 1 refills | Status: AC | PRN

## 2020-12-08 MED ORDER — OMEPRAZOLE 40 MG PO CPDR: 40.0000 mg | DELAYED_RELEASE_CAPSULE | Freq: Every day | ORAL | 2 refills | Status: AC

## 2020-12-08 NOTE — ED Provider Notes (Signed)
[facility] Emergency Department Provider Note  ____________________________________________   Event Date/Time   First MD Initiated Contact with Patient 12/08/20 [phone]     (approximate)  I have reviewed the triage vital signs and the nursing notes.   HISTORY  Chief Complaint Abdominal Pain  The patient and/or family speak(s) Spanish.  They understand they have the right to the use of a hospital interpreter, however at this time they prefer to speak directly with me in Spanish.  They know that they can ask for an interpreter at any time.   HPI Shown Randall Thomas is a 35 y.o. male with medical history as listed below which notably includes a history of gallstones and chronic epigastric pain as well as a history of H. pylori infection.  He presents for evaluation of left upper quadrant abdominal pain for about a week.  Nothing particular makes it better or worse including eating.  He describes it as a dull pain.  It is constant but mild.  He has had no nausea, vomiting, diarrhea, nor constipation (he said he has a little bit of chronic constipation but nothing new or different).  No recent trauma or injury.  He denies fever, sore throat, chest pain, shortness of breath.  He went to an outpatient clinic today and may or may not have had some imaging and was told to go to the emergency department.  He said that he was told in the past that he had gallstones but he has not had surgery to have them removed because it has not yet been necessary.     Past Medical History:  Diagnosis Date   Acute pain of left knee 09/21/2014   Ankle pain 02/28/2013   Chronic epigastric pain 09/07/2014   Gallstones    H. pylori infection 04/27/2014   Right upper quadrant abdominal pain     Patient Active Problem List   Diagnosis Date Noted   Right upper quadrant abdominal pain    Gallstones    Acute pain of left knee 09/21/2014   Chronic epigastric pain 09/07/2014   H.  pylori infection 04/27/2014   Bilateral impacted cerumen 12/22/2013   GERD (gastroesophageal reflux disease) 03/05/2013   Ankle pain 02/28/2013    Past Surgical History:  Procedure Laterality Date   NO PAST SURGERIES      Prior to Admission medications   Medication Sig Start Date End Date Taking? Authorizing Provider  dicyclomine (BENTYL) 10 MG capsule Take 1 capsule (10 mg total) by mouth 3 (three) times daily as needed for spasms (abdominal pain and cramping). 12/08/20  Yes Loleta Rose, MD  sucralfate (CARAFATE) 1 g tablet Take 1 tablet (1 g total) by mouth 4 (four) times daily as needed (for abdominal discomfort, nausea, and/or vomiting). 12/08/20  Yes Loleta Rose, MD  omeprazole (PRILOSEC) 40 MG capsule Take 1 capsule (40 mg total) by mouth daily. 12/08/20   Loleta Rose, MD    Allergies Patient has no known allergies.  Family History  Problem Relation Age of Onset   Healthy Mother    Healthy Father    Cancer Neg Hx     Social History Social History   Tobacco Use   Smoking status: Never   Smokeless tobacco: Never  Substance Use Topics   Alcohol use: Yes    Comment: social   Drug use: No    Review of Systems Constitutional: No fever/chills Eyes: No visual changes. ENT: No sore throat. Cardiovascular: Denies chest pain. Respiratory: Denies  shortness of breath. Gastrointestinal: Left upper quadrant abdominal pain x1 week.  No nausea, vomiting, diarrhea, nor acute constipation. Genitourinary: Negative for dysuria. Musculoskeletal: Negative for neck pain.  Negative for back pain. Integumentary: Negative for rash. Neurological: Negative for headaches, focal weakness or numbness.   ____________________________________________   PHYSICAL EXAM:  VITAL SIGNS: ED Triage Vitals [12/07/20 1556]  Enc Vitals Group     BP (!) 144/99     Pulse Rate 78     Resp 18     Temp 98.4 F (36.9 C)     Temp Source Oral     SpO2 99 %     Weight 72.6 kg (160 lb)      Height 1.727 m (5\' 8" )     Head Circumference      Peak Flow      Pain Score 5     Pain Loc      Pain Edu?      Excl. in GC?     Constitutional: Alert and oriented.  Eyes: Conjunctivae are normal.  Head: Atraumatic. Nose: No congestion/rhinnorhea. Mouth/Throat: Patient is wearing a mask. Neck: No stridor.  No meningeal signs.   Cardiovascular: Normal rate, regular rhythm. Good peripheral circulation. Respiratory: Normal respiratory effort.  No retractions. Gastrointestinal: Soft and nontender. No distention.  Negative Murphy sign. Musculoskeletal: No lower extremity tenderness nor edema. No gross deformities of extremities. Neurologic:  Normal speech and language. No gross focal neurologic deficits are appreciated.  Skin:  Skin is warm, dry and intact. Psychiatric: Mood and affect are normal. Speech and behavior are normal.  ____________________________________________   LABS (all labs ordered are listed, but only abnormal results are displayed)  Labs Reviewed  COMPREHENSIVE METABOLIC PANEL - Abnormal; Notable for the following components:      Result Value   Glucose, Bld 101 (*)    Total Protein 8.3 (*)    All other components within normal limits  CBC - Abnormal; Notable for the following components:   WBC 11.6 (*)    All other components within normal limits  LIPASE, BLOOD  URINALYSIS, ROUTINE W REFLEX MICROSCOPIC   ____________________________________________   RADIOLOGY I, , personally viewed and evaluated these images (plain radiographs) as part of my medical decision making, as well as reviewing the written report by the radiologist.  ED MD interpretation: Cholelithiasis with some biliary sludge, no acute cholecystitis.  Official radiology report(s): Loleta Rose ABDOMEN LIMITED RUQ (LIVER/GB)  Result Date: 12/08/2020 CLINICAL DATA:  Mid abdominal pain for several days. EXAM: ULTRASOUND ABDOMEN LIMITED RIGHT UPPER QUADRANT COMPARISON:  None. FINDINGS:  Gallbladder: A 2.4 cm shadowing echogenic gallstone is seen within the dependent portion of the gallbladder lumen. A mild amount of echogenic sludge is also present. The gallbladder wall measures 3.2 mm in thickness. No sonographic Murphy sign noted by sonographer. Common bile duct: Diameter: 3.1 mm Liver: No focal lesion identified. Within normal limits in parenchymal echogenicity. Portal vein is patent on color Doppler imaging with normal direction of blood flow towards the liver. Other: None. IMPRESSION: Cholelithiasis and gallbladder sludge, without acute cholecystitis. Further evaluation with a nuclear medicine hepatobiliary scan is recommended if this remains of clinical concern. Electronically Signed   By: 12/10/2020 M.D.   On: 12/08/2020 02:34    ____________________________________________   PROCEDURES   Procedure(s) performed (including Critical Care):  Procedures   ____________________________________________   INITIAL IMPRESSION / MDM / ASSESSMENT AND PLAN / ED COURSE  As part of my medical decision making, I reviewed  the following data within the electronic MEDICAL RECORD NUMBER Nursing notes reviewed and incorporated, Labs reviewed , Old chart reviewed, and Notes from prior ED visits   Differential diagnosis includes, but is not limited to, biliary colic, diverticulitis, SBO/ileus, splenomegaly.  Patient is well-appearing and in no distress.  Due to overwhelming ED and hospital patient volumes, the patient has been waiting for about 10 hours.  His vital signs are stable, he is afebrile.  He has no tenderness to palpation of the abdomen and a negative Murphy sign.  CBC is essentially normal other than a very slightly elevated white blood cell count at 11.6.  Lipase is normal, normal LFTs, normal comprehensive metabolic panel.  I reviewed the medical record including the outpatient visit note from today.  They referred to some imaging which I believe must of been x-rays and how  there was a "abnormality in the small bowel" and the clinic doctor said that the radiologist recommended a CT scan.  However I cannot find this report in care everywhere nor in [facility], and the patient's physical exam does not correlate in any way to an acute intra-abdominal infection nor SBO/ileus.  I do not feel that a CT scan is warranted based on the patient's current presentation.  However given his known gallstones, I evaluated with an ultrasound.  This is notable for cholelithiasis and some gallbladder sludge.  I believe that he is suffering from some biliary colic but there is no indication that he needs emergent surgery.  I updated the patient about these findings and strongly encouraged him to follow-up with surgery at the next available opportunity.  I provided him with outpatient follow-up information.  I provided a prescription for Bentyl which may be helpful.  The patient says the he understands and agrees with the plan.  I gave my usual and customary return precautions.          ____________________________________________  FINAL CLINICAL IMPRESSION(S) / ED DIAGNOSES  Final diagnoses:  Biliary colic     MEDICATIONS GIVEN DURING THIS VISIT:  Medications - No data to display   ED Discharge Orders          Ordered    omeprazole (PRILOSEC) 40 MG capsule  Daily        12/08/20 0438    dicyclomine (BENTYL) 10 MG capsule  3 times daily PRN        12/08/20 0438    sucralfate (CARAFATE) 1 g tablet  4 times daily PRN        12/08/20 0438             Note:  This document was prepared using Dragon voice recognition software and may include unintentional dictation errors.   Loleta Rose, MD 12/08/20 [phone]

## 2020-12-23 ENCOUNTER — Telehealth: Payer: Self-pay | Admitting: Surgery

## 2020-12-23 NOTE — Telephone Encounter (Signed)
We have tried to contact patient multiple times and have not been successful patient has no voicemail setup to leave msgs if the patient calls he needs to be setup with Dr. Everlene Farrier for Biliary colic.

## 2022-08-02 ENCOUNTER — Ambulatory Visit: Payer: Self-pay | Admitting: Surgery

## 2022-08-16 ENCOUNTER — Ambulatory Visit: Payer: Managed Care, Other (non HMO) | Admitting: Surgery

## 2022-08-16 ENCOUNTER — Encounter: Payer: Self-pay | Admitting: Surgery

## 2022-08-16 VITALS — BP 158/104 | HR 81 | Temp 98.5°F | Wt 206.2 lb

## 2022-08-16 DIAGNOSIS — K8020 Calculus of gallbladder without cholecystitis without obstruction: Secondary | ICD-10-CM

## 2022-08-16 DIAGNOSIS — K219 Gastro-esophageal reflux disease without esophagitis: Secondary | ICD-10-CM

## 2022-08-16 NOTE — Progress Notes (Signed)
08/16/2022  Reason for Visit:  Cholelithiasis  Requesting Provider:  Lorenda Cahill, FNP  History of Present Illness: Randall Thomas is a 37 y.o. male presenting for evaluation of cholelithiasis.  The patient has had a few ED visits over the past 6 years for abdominal pain.  He was initially seen by Dr. Tonita Cong and me in 2018.  At that time, although he did have cholelithiasis, he also mentioned diagnosis of gastritis and H pylori in 2016.  He was referred to Gastroenterology but the patient canceled his appointment and did not reschedule.  He presented to the ED in 2022 for epigastric pain and U/S showed again cholelithiasis, but was not able to be contacted for an appointment despite of multiple tries.    Today he reports that he's still having epigastric abdominal pain.  The pain does not radiate anywhere and he does not have any RUQ pain.  He also reports that sometimes he feels that food is getting stuck at the bottom of his chest.  He also reports at times the discomfort may feel like chest pressure.  Also reports that sometimes he'll wake up with acidic taste in his mouth.  Denies any nausea or emesis.  His last episode of pain happened after eating spicy foods.  He has had some discomfort after greasy food.  He currently does not take a PPI.  Past Medical History: Past Medical History:  Diagnosis Date   Acute pain of left knee 09/21/2014   Ankle pain 02/28/2013   Chronic epigastric pain 09/07/2014   Gallstones    H. pylori infection 04/27/2014   Right upper quadrant abdominal pain      Past Surgical History: Past Surgical History:  Procedure Laterality Date   NO PAST SURGERIES      Home Medications: Prior to Admission medications   Medication Sig Start Date End Date Taking? Authorizing Provider  dicyclomine (BENTYL) 10 MG capsule Take 1 capsule (10 mg total) by mouth 3 (three) times daily as needed for spasms (abdominal pain and cramping). 12/08/20  Yes Loleta Rose,  MD  omeprazole (PRILOSEC) 40 MG capsule Take 1 capsule (40 mg total) by mouth daily. 12/08/20  Yes Loleta Rose, MD  sucralfate (CARAFATE) 1 g tablet Take 1 tablet (1 g total) by mouth 4 (four) times daily as needed (for abdominal discomfort, nausea, and/or vomiting). 12/08/20  Yes Loleta Rose, MD    Allergies: No Known Allergies  Social History:  reports that he has never smoked. He has never used smokeless tobacco. He reports current alcohol use. He reports that he does not use drugs.   Family History: Family History  Problem Relation Age of Onset   Healthy Mother    Healthy Father    Cancer Neg Hx     Review of Systems: Review of Systems  Constitutional:  Negative for chills and fever.  Respiratory:  Negative for shortness of breath.   Cardiovascular:  Negative for chest pain.  Gastrointestinal:  Positive for abdominal pain. Negative for nausea and vomiting.  Genitourinary:  Negative for dysuria.  Musculoskeletal:  Negative for myalgias.  Skin:  Negative for rash.    Physical Exam BP (!) 158/104   Pulse 81   Temp 98.5 F (36.9 C) (Oral)   Wt 206 lb 3.2 oz (93.5 kg)   SpO2 97%   BMI 31.35 kg/m  CONSTITUTIONAL: No acute distress, well nourished. HEENT:  Normocephalic, atraumatic, extraocular motion intact.  RESPIRATORY:  Lungs are clear, and breath sounds are equal  bilaterally. Normal respiratory effort without pathologic use of accessory muscles. CARDIOVASCULAR: Heart is regular without murmurs, gallops, or rubs. GI: The abdomen is soft, non-distended, with mild discomfort in epigastric area.  No pain in RUQ.  Negative Murphy's sign.  MUSCULOSKELETAL:  Normal muscle strength and tone in all four extremities.  No peripheral edema or cyanosis. NEUROLOGIC:  Motor and sensation is grossly normal.  Cranial nerves are grossly intact. PSYCH:  Alert and oriented to person, place and time. Affect is normal.  Laboratory Analysis: Labs from 12/07/20: Na 137, K 3.8, Cl 101,  CO2 29, BUN 13, Cr 0.78.  Total bili 1.0, AST 19, ALT 28, Alk Phos 57, lipase 31.  WBC 11.6, Hgb 16.0, Hct 45.4, Plt 171.  Imaging: Ultrasound RUQ 12/08/20: IMPRESSION: Cholelithiasis and gallbladder sludge, without acute cholecystitis. Further evaluation with a nuclear medicine hepatobiliary scan is recommended if this remains of clinical concern.  Assessment and Plan: This is a 37 y.o. male with cholelithiasis and GERD.  --The patient reports symptoms that can potentially be related to either cholelithiasis or GERD.  Discussed how both can happen with foods, can sometimes both be in epigastric area.  However, he does have a history of gastritis and H pylori and he's currently not taking any PPI.  Although he can have pain from both biliary and gastric sources, we need to make sure that offering him surgery is actually going to help in the long term.  Discussed with him that again we should do a PPI trial.  Recommended that he start Prilosec once daily for a month, without skipping doses.  Discussed how only as needed doses will not help overall.  Will given information about foods to avoid for GERD and also foods to avoid for cholelithiasis.   --He will follow up with me in a month.  If his symptoms improve with PPI, may not need any cholecystectomy, but if no changes, then likely his symptoms are from biliary colic.   --He is also wondering about possible cost issues if he does need surgery.  He does have insurance so would not qualify for [facility] financial assistance.  Will give him the name and CPT code of the planned surgery if he needs one so he can contact his insurance company to ask what his portion of payment would be. --Follow up 1 month.  I spent 30 minutes dedicated to the care of this patient on the date of this encounter to include pre-visit review of records, face-to-face time with the patient discussing diagnosis and management, and any post-visit coordination of  care.   [name], MD [facility]

## 2022-08-16 NOTE — Patient Instructions (Addendum)
Please take Prilosec OTC daily for 30 days.  See follow up appointment.   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 La vescula biliar es un rgano que tiene forma de pera y se encuentra debajo del hgado, del lado derecho del cuerpo. La vescula biliar almacena bilis, un lquido que ayuda al organismo a digerir las grasas. La colecistectoma se realiza con frecuencia para tratar la inflamacin (irritacin e hinchazn) de la vescula biliar (colecistitis). Por lo general, esta afeccin se debe a una acumulacin de clculos biliares (colelitiasis) en la vescula biliar o al estancamiento del lquido de la vescula biliar a causa de que los clculos biliares se atascan en los conductos (tubos) y obstruyen el paso de la bilis. Esto puede producir inflamacin y Engineer, mining. En los [employer], podr ser [place]. Este procedimiento se realiza a travs de pequeas incisiones en el abdomen, en lugar de una incisin grande. Tambin se denomina "ciruga laparoscpica". Se introduce un endoscopio delgado que tiene Secretary/administrator (laparoscopio) a travs de una incisin. A travs de las otras incisiones, se introducen los instrumentos quirrgicos. En algunos casos, es posible que [place] mnimamente invasiva deba cambiarse a una [place] realizada a travs de una incisin ms grande. Esta se denomina "ciruga abierta". Informe al mdico acerca de lo siguiente: Cualquier alergia que tenga. Todos los [employer] [place], incluidos vitaminas, hierbas, gotas oftlmicas, cremas y [address] de [address]. Problemas previos que usted o algn miembro de su familia hayan tenido con los anestsicos. Cualquier problema de la sangre que tenga. Cirugas a las que se haya sometido. Cualquier afeccin mdica que tenga. Si est embarazada  o podra estarlo. Cules son los riesgos? En general, se trata de un procedimiento seguro. Sin embargo, pueden ocurrir complicaciones, por ejemplo: Infeccin. Sangrado. Reacciones alrgicas a los medicamentos. Daos a las estructuras o los rganos cercanos. Un clculo biliar que queda en el conducto biliar comn. El conducto coldoco transporta la bilis desde la vescula biliar hasta el intestino delgado. Una filtracin de bilis del hgado o del conducto qustico despus de que se extirpa la vescula biliar. Qu ocurre antes del procedimiento? Cundo dejar de comer y beber Siga las instrucciones del mdico con respecto a lo que puede comer y beber antes del procedimiento. Pueden incluir: Ocho horas antes del procedimiento Deje de comer la mayora de los alimentos. No coma carne, alimentos fritos ni alimentos grasos. Consuma solo alimentos livianos, como tostadas o Social worker. Todos los lquidos son aceptables, excepto las bebidas energticas y el alcohol. Seis horas antes del procedimiento Deje de comer. Beba nicamente lquidos transparentes, como agua, jugo de fruta transparente, caf solo, t solo y bebidas deportivas. No consuma bebidas energticas ni alcohol. Dos horas antes del procedimiento Deje de beber todos los lquidos. Es posible que le permitan tomar medicamentos con pequeos sorbos de [place]. Si no sigue las instrucciones del mdico, el procedimiento puede retrasarse o cancelarse. Medicamentos Consulte al mdico si debe hacer o no lo siguiente: Multimedia programmer o suspender los medicamentos que [place] habitualmente. Esto es muy importante si toma medicamentos para la diabetes o anticoagulantes. Tomar medicamentos como aspirina e ibuprofeno. Estos medicamentos pueden tener un efecto anticoagulante en la [place]. No tome estos medicamentos a menos que el mdico se lo indique. Usar medicamentos de venta libre, vitaminas, hierbas y suplementos. Instrucciones generales Si va a  marcharse a su casa inmediatamente  despus del procedimiento, pdale a un adulto responsable que: Lo lleve a su casa desde el hospital o la clnica. No se le permitir conducir. Lo cuide durante el [employer] indiquen. No consuma ningn producto que contenga nicotina ni tabaco durante al [employer] las 4 semanas anteriores al procedimiento. Estos productos incluyen cigarrillos, tabaco para Theatre manager y aparatos de vapeo, como los Administrator, Civil Service. Si necesita ayuda para dejar de fumar, consulte al [employer]. Pregntele al mdico: Cmo se Forensic psychologist de la Leisure centre manager. Qu medidas se tomarn para evitar una infeccin. Pueden incluir: Rasurar el vello del lugar de la ciruga. Lavar la piel con un jabn antisptico. Recibir antibiticos. Qu ocurre durante el procedimiento?  Le colocarn una va intravenosa (i.v.) en una vena. Le administrarn uno de los siguientes medicamentos o ambos: Un medicamento para ayudar a Lexicographer (sedante). Un medicamento que lo har dormir (anestesia general). Su cirujano le har varias incisiones pequeas en el abdomen. El laparoscopio se introducir a travs de una de las pequeas incisiones. La cmara del laparoscopio enviar imgenes a un monitor que se encuentra en el quirfano. Esto permitir a su Pensions consultant del abdomen. Le inyectarn un gas en el abdomen. Esto expandir el abdomen para que el cirujano tenga ms lugar para Facilities manager. El resto del instrumental necesario para el procedimiento se introducir a travs de las otras incisiones. Se extirpar la vescula biliar a travs de una de las incisiones. Se puede examinar el conducto coldoco. Si se encuentran clculos en la va biliar, tal vez deban extirparse. Despus de la extirpacin de la vescula biliar, se cerrarn las incisiones con puntos (suturas), grapas o goma para cerrar la piel. Las incisiones pueden cubrirse con una venda (vendaje). El procedimiento puede variar segn el mdico y  el hospital. Ladell Heads ocurre despus del procedimiento? Le controlarn la presin arterial, la frecuencia cardaca, la frecuencia respiratoria y Air cabin crew de oxgeno en la sangre hasta que le den el alta del hospital o la clnica. Le darn analgsicos para Human resources officer, si es necesario. Es posible que le coloquen un drenaje en la incisin. Se lo retirarn [employer] despus del procedimiento. Resumen La colecistectoma mnimamente invasiva, tambin llamada colecistectoma laparoscpica, es una ciruga que se realiza para extirpar la vescula biliar a travs de pequeas incisiones. Informe a su mdico sobre todas las otras afecciones que tenga y [place] todos los medicamentos que est usando para dichas afecciones. Antes del procedimiento, siga las instrucciones sobre cundo dejar de comer y beber y Tacy Dura cambiar o suspender medicamentos. Haga que un adulto responsable lo cuide durante el tiempo que le indiquen despus de que le den el alta del hospital o de la clnica. Esta informacin no tiene Theme park manager el consejo del mdico. Asegrese de hacerle al mdico cualquier pregunta que tenga.  Plan de alimentacin para problemas de vescula biliar Gallbladder Eating Plan Un nivel alto de colesterol en la sangre, la obesidad, un estilo de vida sedentario, una dieta poco saludable y la diabetes son factores de riesgo para presentar clculos biliares. Si tiene una afeccin de la vescula biliar, puede tener problemas para digerir las grasas y Web designer una ingesta alta de grasas. Consumir una dieta con bajo contenido de grasas puede [employer] sntomas, y puede ser beneficiosa antes y despus de [place] de extraccin de vescula biliar (colecistectoma). El mdico puede recomendarle que trabaje con un nutricionista para que lo ayude a reducir la cantidad de grasas en su dieta. Cules  son algunos consejos para seguir este plan? Pautas generales Limite el consumo de grasas a menos del 30 %  del total de caloras diarias. Si usted ingiere alrededor de 1,800 caloras diarias, esto significa comer menos de 60 gramos (g) de Automotive engineer. La grasa es una parte importante de una dieta saludable. Consumir una dieta con bajo contenido de grasas puede dificultar mantener un peso corporal saludable. Pregunte a su nutricionista qu cantidad de grasas, caloras y otros nutrientes necesita diariamente. Haga comidas pequeas y frecuentes Freight forwarder de tres comidas abundantes. Beba de 8 a 10 vasos (1.9 a 2.4 litros) de lquido por da, como mnimo. Beber suficiente lquido como para Pharmacologist la orina de color amarillo plido. Si bebe alcohol: Limite la cantidad que bebe a lo siguiente: De 0 a 1 medida por da para las mujeres que no estn embarazadas. De 0 a 2 medidas por da para los hombres. Sepa cunta cantidad de alcohol hay en las bebidas. En los [address], una medida equivale a una botella de cerveza de 12 oz (355 ml), un vaso de vino de 5 oz (148 ml) o un vaso de una bebida alcohlica de alta graduacin de 1 oz (44 ml). Lea las etiquetas de los alimentos  Consulte la informacin nutricional en las etiquetas de los alimentos para conocer la cantidad de grasas por porcin. Elija alimentos con menos de 3 gramos de grasas por porcin. Al ir de compras Elija alimentos saludables sin grasas o con bajo contenido de grasas. Busque las palabras "sin grasa", "bajo en grasas" o "con bajo contenido de grasas". Evite comprar alimentos procesados o preenvasados. Al cocinar Para cocinar opte por mtodos con bajo contenido de grasa, como hornear, hervir, Software engineer y Transport planner. Cocine con pequeas cantidades de grasas saludables, como aceite de [place], aceite de semilla de [place], aceite de [place], aceite de [address] o aceite de [place]. Qu alimentos se recomiendan?  Todas las frutas y verduras frescas, congeladas o enlatadas. Cereales integrales. Leche y yogur semidescremados y  descremados. Vita [place], aves sin piel, pescado, huevos y legumbres. Suplementos proteicos con bajo contenido de grasas, en polvo o lquidos. Hierbas y especias. Es posible que los productos detallados arriba no constituyan una lista completa de los alimentos y las bebidas que puede tomar. Consulte a un nutricionista para obtener ms informacin. Qu alimentos no se recomiendan? Alimentos muy grasos. Entre estos se incluyen productos panificados, comida rpida, cortes de carne con grasa, helados, pan francs, rosquillas dulces, pizza, pan de queso, alimentos cubiertos con [place], salsas con crema o queso. Comidas fritas. Se incluyen papas fritas, tempura, pescado rebozado, milanesas de pollo, panes fritos y dulces. Alimentos que causan gases o meteorismo. Es posible que los productos que se enumeran ms arriba no constituyan una lista completa de los alimentos que Personnel officer. Consulte a un nutricionista para obtener ms informacin. Resumen Una dieta de bajo contenido graso puede ser beneficiosa si tiene una afeccin de la vescula biliar o puede hacerla antes y despus de someterse a una ciruga de vescula. Limite el consumo de grasas a menos del 30 % del total de caloras diarias. Esto es casi 60 gramos de grasa si usted ingiere 1,800 caloras diarias. Haga comidas pequeas y frecuentes Freight forwarder de tres comidas abundantes. Esta informacin no tiene Theme park manager el consejo del mdico. Asegrese de hacerle al mdico cualquier pregunta que tenga. Document Revised: 02/10/2021 Document Reviewed: 02/10/2021 Elsevier Patient Education  2024 [employer].

## 2022-09-27 ENCOUNTER — Ambulatory Visit: Payer: Managed Care, Other (non HMO) | Admitting: Surgery

## 2022-10-11 ENCOUNTER — Ambulatory Visit: Payer: Managed Care, Other (non HMO) | Admitting: Surgery

## 2023-01-23 ENCOUNTER — Other Ambulatory Visit: Payer: Self-pay

## 2023-01-23 ENCOUNTER — Emergency Department
Admission: EM | Admit: 2023-01-23 | Discharge: 2023-01-23 | Disposition: A | Discharge status: Home / Self Care | Payer: Self-pay | Attending: Emergency Medicine | Admitting: Emergency Medicine

## 2023-01-23 ENCOUNTER — Emergency Department: Payer: Self-pay

## 2023-01-23 DIAGNOSIS — R1013 Epigastric pain: Secondary | ICD-10-CM | POA: Diagnosis present

## 2023-01-23 DIAGNOSIS — K8020 Calculus of gallbladder without cholecystitis without obstruction: Secondary | ICD-10-CM | POA: Insufficient documentation

## 2023-01-23 LAB — COMPREHENSIVE METABOLIC PANEL
ALT: 36 U/L (ref 0–44)
AST: 25 U/L (ref 15–41)
Albumin: 4.7 g/dL (ref 3.5–5.0)
Alkaline Phosphatase: 53 U/L (ref 38–126)
Anion gap: 9 (ref 5–15)
BUN: 14 mg/dL (ref 6–20)
CO2: 24 mmol/L (ref 22–32)
Calcium: 8.8 mg/dL — ABNORMAL LOW (ref 8.9–10.3)
Chloride: 100 mmol/L (ref 98–111)
Creatinine, Ser: 0.63 mg/dL (ref 0.61–1.24)
GFR, Estimated: >60 mL/min (ref >60)
Glucose, Bld: 119 mg/dL — ABNORMAL HIGH (ref 70–99)
Potassium: 3.7 mmol/L (ref 3.5–5.1)
Sodium: 133 mmol/L — ABNORMAL LOW (ref 135–145)
Total Bilirubin: 0.8 mg/dL (ref <1.2)
Total Protein: 8.1 g/dL (ref 6.5–8.1)

## 2023-01-23 LAB — URINALYSIS, ROUTINE W REFLEX MICROSCOPIC
Bacteria, UA: NONE SEEN
Bilirubin Urine: NEGATIVE
Glucose, UA: NEGATIVE mg/dL
Ketones, ur: NEGATIVE mg/dL
Leukocytes,Ua: NEGATIVE
Nitrite: NEGATIVE
Protein, ur: NEGATIVE mg/dL
Specific Gravity, Urine: 1.023 (ref 1.005–1.030)
Squamous Epithelial / HPF: 0 /[HPF] (ref 0–5)
pH: 6 (ref 5.0–8.0)

## 2023-01-23 LAB — CBC
HCT: 46.2 % (ref 39.0–52.0)
Hemoglobin: 16.1 g/dL (ref 13.0–17.0)
MCH: 28.9 pg (ref 26.0–34.0)
MCHC: 34.8 g/dL (ref 30.0–36.0)
MCV: 82.8 fL (ref 80.0–100.0)
Platelets: 211 10*3/uL (ref 150–400)
RBC: 5.58 MIL/uL (ref 4.22–5.81)
RDW: 12.9 % (ref 11.5–15.5)
WBC: 9.9 10*3/uL (ref 4.0–10.5)
nRBC: 0 % (ref 0.0–0.2)

## 2023-01-23 LAB — LIPASE, BLOOD: Lipase: 28 U/L (ref 11–51)

## 2023-01-23 LAB — TROPONIN I (HIGH SENSITIVITY)
Troponin I (High Sensitivity): 3 ng/L (ref <18)
Troponin I (High Sensitivity): 3 ng/L (ref <18)

## 2023-01-23 MED ORDER — ONDANSETRON 4 MG PO TBDP: 4.0000 mg | ORAL_TABLET | Freq: Four times a day (QID) | ORAL | 0 refills | Status: AC | PRN

## 2023-01-23 NOTE — Discharge Instructions (Addendum)
? ?  Please return to the emergency room right away if you are to develop a fever, severe nausea, your pain becomes severe or worsens, you are unable to keep food down, begin vomiting any dark or bloody fluid, you develop any dark or bloody stools, feel dehydrated, or other new concerns or symptoms arise. ? ?

## 2023-01-23 NOTE — ED Triage Notes (Addendum)
Pt to ED via POV c/o epigastric pain that started around 5pm yesterday. Pt reports it feels like a burning sensation. Endorses some nausea. Denies vomiting, diarrhea, CP, SOB, fevers. Hx of gallstones

## 2023-01-23 NOTE — ED Provider Notes (Signed)
[facility] Provider Note    Event Date/Time   First MD Initiated Contact with Patient 01/23/23 0805     (approximate)   History   Abdominal Pain  In person, Spanish interpreter Marchelle Folks utilized throughout  HPI  Randall Thomas Randall Thomas is a 37 y.o. male who has a history of cholelithiasis also reports a history of previous Helicobacter   Yesterday at about 5 PM patient reports he started having abdominal pain it is in his very middle upper abdomen.  He has had it before off and on for the last few years.  Accompanied by mild nausea but no vomiting no fevers no chills.  He does not take any medications.  He has had no chest pain  The pain has improved since last night but is very very mild located minimally points at his epigastrium.  Denies pain in the right upper abdomen or other areas of the abdomen  No diarrhea or changes in bowel  He reports he thinks he may have been treated for gastritis or Helicobacter when he was about 53 or 37 years old  Physical Exam   Triage Vital Signs: ED Triage Vitals  Encounter Vitals Group     BP 01/23/23 0239 (!) 156/94     Systolic BP Percentile --      Diastolic BP Percentile --      Pulse Rate 01/23/23 0239 62     Resp 01/23/23 0239 16     Temp 01/23/23 0239 98.4 F (36.9 C)     Temp Source 01/23/23 0239 Oral     SpO2 01/23/23 0239 98 %     Weight 01/23/23 0236 205 lb (93 kg)     Height 01/23/23 0236 5\' 7"  (1.702 m)     Head Circumference --      Peak Flow --      Pain Score 01/23/23 0236 6     Pain Loc --      Pain Education --      Exclude from Growth Chart --     Most recent vital signs: Vitals:   01/23/23 0516 01/23/23 0921  BP: (!) 157/98 (!) 150/90  Pulse: 69 70  Resp: 18 18  Temp: 98.3 F (36.8 C) 98 F (36.7 C)  SpO2: 100% 100%     General: Awake, no distress.  CV:  Good peripheral perfusion.  Resp:  Normal effort.  Abd:  No distention.  Soft nontender nondistended throughout  except he reports a slight sense of discomfort in the epigastrium only.  There is no rebound no guarding no pain McBurney's point.  Negative Murphy Other:  Skin appears normal.  No jaundice   ED Results / Procedures / Treatments   Labs (all labs ordered are listed, but only abnormal results are displayed) Labs Reviewed  COMPREHENSIVE METABOLIC PANEL - Abnormal; Notable for the following components:      Result Value   Sodium 133 (*)    Glucose, Bld 119 (*)    Calcium 8.8 (*)    All other components within normal limits  URINALYSIS, ROUTINE W REFLEX MICROSCOPIC - Abnormal; Notable for the following components:   Color, Urine YELLOW (*)    APPearance CLEAR (*)    Hgb urine dipstick SMALL (*)    All other components within normal limits  LIPASE, BLOOD  CBC  TROPONIN I (HIGH SENSITIVITY)  TROPONIN I (HIGH SENSITIVITY)     EKG  interpreted by me at approximately 8 AM, heart rate  65 QRS 100 QTc 440 Normal sinus rhythm no evidence of acute ischemia   RADIOLOGY  US ABDOMEN LIMITED RUQ (LIVER/GB)  Result Date: 01/23/2023 CLINICAL DATA:  [id number] with upper abdominal pain for 12 hours. EXAM: ULTRASOUND ABDOMEN LIMITED RIGHT UPPER QUADRANT COMPARISON:  Right upper quadrant ultrasound 12/08/2020. FINDINGS: Gallbladder: There is no positive sonographic Murphy's sign, free wall thickening or pericholecystic fluid. A stone in the proximal gallbladder is noted measuring 2.2 cm. There is mild echogenic layering sludge. Common bile duct: Diameter: 2.5 mm.  No intrahepatic bile duct dilatation is seen. Liver: No focal lesion identified. Within normal limits in parenchymal echogenicity. Portal vein is patent on color Doppler imaging with normal direction of blood flow towards the liver. Other: None. IMPRESSION: 1. Cholelithiasis and mild gallbladder sludge. No evidence of acute cholecystitis. 2. No biliary ductal dilatation. Electronically Signed   By: Almira Bar M.D.   On: 01/23/2023 04:28       PROCEDURES:  Critical Care performed: No  Procedures   MEDICATIONS ORDERED IN ED: Medications - No data to display   IMPRESSION / MDM / ASSESSMENT AND PLAN / ED COURSE  I reviewed the triage vital signs and the nursing notes.                              Differential diagnosis includes, but is not limited to, gastritis, chronic epigastric pain, peptic ulcer disease, Helicobacter, enteritis etc.  He has no associated right upper quadrant pain negative Murphy reassuring ultrasound.  Afebrile normal white count.  No evidence of acute cholecystitis or acute hepatobiliary disease.  Reassuring examination no rebound guarding or peritonitis pain is epigastric and recurrent in nature.  Discussed with the patient via Spanish interpreter, will prescribe Zofran as needed recommended follow-up with gastroenterology for further evaluation, as well as discussed careful return precautions around return signs and symptoms associated with abdominal pain, right upper quadrant pain, fever, etc.  Labs demonstrate normal CBC.  Troponin normal x 2.  Comprehensive metabolic panel also without acute abnormality, LFTs and bilirubin appropriate On review of surgery note from July the patient has a history of epigastric pain, seen by Dr. Aleen Campi   Patient's presentation is most consistent with acute complicated illness / injury requiring diagnostic workup.   Return precautions and treatment recommendations and follow-up discussed with the patient who is agreeable with the plan.        FINAL CLINICAL IMPRESSION(S) / ED DIAGNOSES   Final diagnoses:  Epigastric pain  Calculus of gallbladder without cholecystitis without obstruction     Rx / DC Orders   ED Discharge Orders          Ordered    Ambulatory referral to Gastroenterology       Comments: chronic epigastric pain   01/23/23 0946    ondansetron (ZOFRAN-ODT) 4 MG disintegrating tablet  Every 6 hours PRN        01/23/23 0946              Note:  This document was prepared using Dragon voice recognition software and may include unintentional dictation errors.   [name], MD 01/23/23 [phone]

## 2023-02-19 ENCOUNTER — Encounter: Payer: Self-pay | Admitting: Gastroenterology

## 2023-08-20 ENCOUNTER — Other Ambulatory Visit: Payer: Self-pay | Admitting: Gastroenterology

## 2023-08-20 DIAGNOSIS — K219 Gastro-esophageal reflux disease without esophagitis: Secondary | ICD-10-CM

## 2023-08-20 DIAGNOSIS — R1013 Epigastric pain: Secondary | ICD-10-CM

## 2023-08-20 DIAGNOSIS — K8020 Calculus of gallbladder without cholecystitis without obstruction: Secondary | ICD-10-CM

## 2023-09-06 ENCOUNTER — Encounter: Payer: Self-pay | Admitting: Gastroenterology

## 2023-09-06 ENCOUNTER — Other Ambulatory Visit: Payer: Self-pay

## 2023-09-06 ENCOUNTER — Ambulatory Visit: Admitting: Anesthesiology

## 2023-09-06 ENCOUNTER — Ambulatory Visit
Admission: RE | Admit: 2023-09-06 | Discharge: 2023-09-06 | Disposition: A | Discharge status: Home / Self Care | Attending: Gastroenterology | Admitting: Gastroenterology

## 2023-09-06 ENCOUNTER — Encounter
Admission: RE | Disposition: A | Discharge status: Home / Self Care | Payer: Self-pay | Source: Home / Self Care | Attending: Gastroenterology

## 2023-09-06 DIAGNOSIS — R1013 Epigastric pain: Secondary | ICD-10-CM | POA: Insufficient documentation

## 2023-09-06 DIAGNOSIS — Z800 Family history of malignant neoplasm of digestive organs: Secondary | ICD-10-CM | POA: Insufficient documentation

## 2023-09-06 DIAGNOSIS — K2970 Gastritis, unspecified, without bleeding: Secondary | ICD-10-CM | POA: Diagnosis not present

## 2023-09-06 DIAGNOSIS — K219 Gastro-esophageal reflux disease without esophagitis: Secondary | ICD-10-CM | POA: Insufficient documentation

## 2023-09-06 DIAGNOSIS — Z8619 Personal history of other infectious and parasitic diseases: Secondary | ICD-10-CM | POA: Insufficient documentation

## 2023-09-06 HISTORY — PX: ESOPHAGOGASTRODUODENOSCOPY: SHX5428

## 2023-09-06 SURGERY — EGD (ESOPHAGOGASTRODUODENOSCOPY)
Anesthesia: General

## 2023-09-06 MED ORDER — LIDOCAINE HCL (CARDIAC) PF 100 MG/5ML IV SOSY
PREFILLED_SYRINGE | INTRAVENOUS | Status: DC | PRN
  Administered 2023-09-06: 80 mg via INTRAVENOUS

## 2023-09-06 MED ORDER — PROPOFOL 10 MG/ML IV BOLUS
INTRAVENOUS | Status: DC | PRN
  Administered 2023-09-06 (×3): 30 mg via INTRAVENOUS
  Administered 2023-09-06: 70 mg via INTRAVENOUS
  Administered 2023-09-06: 30 mg via INTRAVENOUS
  Administered 2023-09-06: 50 mg via INTRAVENOUS

## 2023-09-06 MED ORDER — DEXMEDETOMIDINE HCL IN NACL 80 MCG/20ML IV SOLN
INTRAVENOUS | Status: DC | PRN
  Administered 2023-09-06: 8 ug via INTRAVENOUS
  Administered 2023-09-06: 4 ug via INTRAVENOUS

## 2023-09-06 MED ORDER — SODIUM CHLORIDE 0.9 % IV SOLN: INTRAVENOUS | Status: DC

## 2023-09-06 NOTE — Op Note (Signed)
 [facility] Gastroenterology Patient Name: Randall Thomas Procedure Date: 09/06/2023 11:17 AM MRN: [id number] Account #: [id number] Date of Birth: Jun 26, 1985 Admit Type: Outpatient Age: 38 Room: [facility] ENDO ROOM 1 Gender: Male Note Status: Finalized Instrument Name: Upper Endoscope [id number] Procedure:             Upper GI endoscopy Indications:           Epigastric abdominal pain, Suspected gastro-esophageal                         reflux disease Providers:             Elspeth Ozell Onita ROSALEA, DO Referring MD:          Elspeth Ozell Onita DO, DO (Referring MD) Medicines:             Monitored Anesthesia Care Complications:         No immediate complications. Estimated blood loss:                         Minimal. Procedure:             Pre-Anesthesia Assessment:                        - Prior to the procedure, a History and Physical was                         performed, and patient medications and allergies were                         reviewed. The patient is competent. The risks and                         benefits of the procedure and the sedation options and                         risks were discussed with the patient. All questions                         were answered and informed consent was obtained.                         Patient identification and proposed procedure were                         verified by the physician, the nurse, the anesthetist                         and the technician in the endoscopy suite. Mental                         Status Examination: alert and oriented. Airway                         Examination: normal oropharyngeal airway and neck                         mobility. Respiratory Examination: clear to  auscultation. CV Examination: RRR, no murmurs, no S3                         or S4. Prophylactic Antibiotics: The patient does not                         require prophylactic antibiotics. Prior                          Anticoagulants: The patient has taken no anticoagulant                         or antiplatelet agents. ASA Grade Assessment: II - A                         patient with mild systemic disease. After reviewing                         the risks and benefits, the patient was deemed in                         satisfactory condition to undergo the procedure. The                         anesthesia plan was to use monitored anesthesia care                         (MAC). Immediately prior to administration of                         medications, the patient was re-assessed for adequacy                         to receive sedatives. The heart rate, respiratory                         rate, oxygen saturations, blood pressure, adequacy of                         pulmonary ventilation, and response to care were                         monitored throughout the procedure. The physical                         status of the patient was re-assessed after the                         procedure.                        After obtaining informed consent, the endoscope was                         passed under direct vision. Throughout the procedure,                         the patient's blood pressure, pulse, and oxygen  saturations were monitored continuously. The Endoscope                         was introduced through the mouth, and advanced to the                         second part of duodenum. The upper GI endoscopy was                         accomplished without difficulty. The patient tolerated                         the procedure well. Findings:      The duodenal bulb, first portion of the duodenum and second portion of       the duodenum were normal. Biopsies for histology were taken with a cold       forceps for evaluation of celiac disease. Estimated blood loss was       minimal.      The Z-line was regular. Estimated blood loss: none.      Esophagogastric  landmarks were identified: the gastroesophageal junction       was found at 38 cm from the incisors.      The examined esophagus was normal. Estimated blood loss: none.      Localized moderate inflammation characterized by erosions and erythema,       healing ulcer was found in the gastric antrum. Biopsies were taken with       a cold forceps for Helicobacter pylori testing. Biopsies were taken with       a cold forceps for histology. Estimated blood loss was minimal.      The exam of the stomach was otherwise normal. Impression:            - Normal duodenal bulb, first portion of the duodenum                         and second portion of the duodenum. Biopsied.                        - Z-line regular.                        - Esophagogastric landmarks identified.                        - Normal esophagus.                        - Gastritis. Biopsied.                        -                        Inflamacin moderada localizada caracterizada por                         erosiones y eritema; se encontr una lcera en proceso                         de cicatrizacin en el antro gstrico.                        -  Bulbo duodenal, primera y segunda porcin del                         duodeno normales. Biopsiada.                        [place] Z regular.                        - Relieves esofagogstricos identificados.                        - Esfago normal.                        - Gastritis. Biopsiada. Recommendation:        - Patient has a contact number available for                         emergencies. The signs and symptoms of potential                         delayed complications were discussed with the patient.                         Return to normal activities tomorrow. Written                         discharge instructions were provided to the patient.                        - Discharge patient to home.                        - Resume previous diet.                         - Continue present medications.                        - No ibuprofen , naproxen, or other non-steroidal                         anti-inflammatory drugs.                        - Increase proton pump inhibitor to twice a day for 8                         weeks if not already doing so.                        - Await pathology results.                        - Return to GI clinic as previously scheduled.                        - The findings and recommendations were discussed with                         the patient. Procedure Code(s):     ---  Professional ---                        [phone], Esophagogastroduodenoscopy, flexible,                         transoral; with biopsy, single or multiple Diagnosis Code(s):     --- Professional ---                        K29.70, Gastritis, unspecified, without bleeding                        R10.13, Epigastric pain CPT copyright 2022 American Medical Association. All rights reserved. The codes documented in this report are preliminary and upon coder review may  be revised to meet current compliance requirements. Attending Participation:      I personally performed the entire procedure. Elspeth Jungling, DO [name] DO, DO 09/06/2023 11:33:16 AM This report has been signed electronically. Number of Addenda: 0 Note Initiated On: 09/06/2023 11:17 AM Estimated Blood Loss:  Estimated blood loss was minimal.      [facility]

## 2023-09-06 NOTE — Interval H&P Note (Signed)
 History and Physical Interval Note: Preprocedure H&P from 09/06/23  was reviewed and there was no interval change after seeing and examining the patient.  Written consent was obtained from the patient after discussion of risks, benefits, and alternatives. Patient has consented to proceed with Esophagogastroduodenoscopy with possible intervention   09/06/2023 11:10 AM  Randall Thomas  has presented today for surgery, with the diagnosis of EPIGASTRIC PAIN, GERD,GALLSTONES.  The various methods of treatment have been discussed with the patient and family. After consideration of risks, benefits and other options for treatment, the patient has consented to  Procedure(s) with comments: EGD (ESOPHAGOGASTRODUODENOSCOPY) (N/A) - SPANISH INTERPRETER as a surgical intervention.  The patient's history has been reviewed, patient examined, no change in status, stable for surgery.  I have reviewed the patient's chart and labs.  Questions were answered to the patient's satisfaction.     [name]

## 2023-09-06 NOTE — Anesthesia Postprocedure Evaluation (Signed)
 Anesthesia Post Note  Patient: Randall Thomas  Procedure(s) Performed: EGD (ESOPHAGOGASTRODUODENOSCOPY)  Patient location during evaluation: PACU Anesthesia Type: General Level of consciousness: awake and alert, oriented and patient cooperative Pain management: pain level controlled Vital Signs Assessment: post-procedure vital signs reviewed and stable Respiratory status: spontaneous breathing, nonlabored ventilation and respiratory function stable Cardiovascular status: blood pressure returned to baseline and stable Postop Assessment: adequate PO intake Anesthetic complications: no   No notable events documented.   Last Vitals:  Vitals:   09/06/23 1140 09/06/23 1150  BP: 111/76 111/71  Pulse: 73 66  Resp: 20 13  Temp:    SpO2: 99% 98%    Last Pain:  Vitals:   09/06/23 1130  TempSrc: Temporal  PainSc:                  Alfonso Ruths

## 2023-09-06 NOTE — Transfer of Care (Signed)
 Immediate Anesthesia Transfer of Care Note  Patient: Randall Thomas  Procedure(s) Performed: EGD (ESOPHAGOGASTRODUODENOSCOPY)  Patient Location: PACU  Anesthesia Type:General  Level of Consciousness: awake, alert , and oriented  Airway & Oxygen Therapy: Patient Spontanous Breathing  Post-op Assessment: Report given to RN and Post -op Vital signs reviewed and stable  Post vital signs: Reviewed and stable  Last Vitals:  Vitals Value Taken Time  BP 98/60 09/06/23 11:30  Temp    Pulse 68 09/06/23 11:31  Resp 17 09/06/23 11:31  SpO2 96 % 09/06/23 11:31  Vitals shown include unfiled device data.  Last Pain:  Vitals:   09/06/23 1037  TempSrc: Oral  PainSc: 1          Complications: No notable events documented.

## 2023-09-06 NOTE — H&P (Signed)
 Pre-Procedure H&P   Patient ID: Randall Thomas is a 38 y.o. male.  Gastroenterology Provider: [name], DO  Referring Provider: Wanda Gails, NP PCP: [facility]  Date: 09/06/2023  HPI Randall Thomas is a 38 y.o. male who presents today for Esophagogastroduodenoscopy for epigastric pain, gerd .  Patient is a native [place] speaker.  Interview performed with video medical Spanish interpreter- Hadassah [phone]  Has had ongoing epigastric pain and reflux for the last 5 years.  Worsens with trigger foods and eating.  No dysphagia or odynophagia. No melena/hematochezia  He does have a history of H. pylori that was treated  Previously heavy NSAID use Family history of gastric cancer-cousin Hemoglobin 16 MCV 83 platelets 211,000 creatinine 0.63   Past Medical History:  Diagnosis Date   Acute pain of left knee 09/21/2014   Ankle pain 02/28/2013   Chronic epigastric pain 09/07/2014   Gallstones    H. pylori infection 04/27/2014   Right upper quadrant abdominal pain     Past Surgical History:  Procedure Laterality Date   NO PAST SURGERIES      Family History Cousin with gastric cancer  No other h/o GI disease or malignancy  Review of Systems  Constitutional:  Negative for activity change, appetite change, chills, diaphoresis, fatigue, fever and unexpected weight change.  HENT:  Negative for trouble swallowing and voice change.   Respiratory:  Negative for shortness of breath and wheezing.   Cardiovascular:  Negative for chest pain, palpitations and leg swelling.  Gastrointestinal:  Positive for abdominal pain. Negative for abdominal distention, anal bleeding, blood in stool, constipation, diarrhea, nausea and vomiting.  Musculoskeletal:  Negative for arthralgias and myalgias.  Skin:  Negative for color change and pallor.  Neurological:  Negative for dizziness, syncope and weakness.  Psychiatric/Behavioral:   Negative for confusion. The patient is not nervous/anxious.   All other systems reviewed and are negative.    Medications No current facility-administered medications on file prior to encounter.   Current Outpatient Medications on File Prior to Encounter  Medication Sig Dispense Refill   dicyclomine  (BENTYL ) 10 MG capsule Take 1 capsule (10 mg total) by mouth 3 (three) times daily as needed for spasms (abdominal pain and cramping). 30 capsule 0   omeprazole  (PRILOSEC) 40 MG capsule Take 1 capsule (40 mg total) by mouth daily. 30 capsule 2   ondansetron  (ZOFRAN -ODT) 4 MG disintegrating tablet Take 1 tablet (4 mg total) by mouth every 6 (six) hours as needed for nausea or vomiting. 20 tablet 0   sucralfate  (CARAFATE ) 1 g tablet Take 1 tablet (1 g total) by mouth 4 (four) times daily as needed (for abdominal discomfort, nausea, and/or vomiting). 30 tablet 1    Pertinent medications related to GI and procedure were reviewed by me with the patient prior to the procedure   Current Facility-Administered Medications:    0.9 %  sodium chloride  infusion, , Intravenous, Continuous, Jungling Elspeth Ozell, DO, Last Rate: 20 mL/hr at 09/06/23 1037, New Bag at 09/06/23 1037  sodium chloride  20 mL/hr at 09/06/23 1037       No Known Allergies Allergies were reviewed by me prior to the procedure  Objective   Body mass index is 31.89 kg/m. Vitals:   09/06/23 1037  BP: (!) 142/90  Pulse: 71  Resp: 16  Temp: 98.3 F (36.8 C)  TempSrc: Oral  SpO2: 100%  Weight: 92.4 kg  Height: 5' 7 (1.702 m)  Physical Exam Vitals and nursing note reviewed.  Constitutional:      General: He is not in acute distress.    Appearance: Normal appearance. He is not ill-appearing, toxic-appearing or diaphoretic.  HENT:     Head: Normocephalic and atraumatic.     Nose: Nose normal.     Mouth/Throat:     Mouth: Mucous membranes are moist.     Pharynx: Oropharynx is clear.  Eyes:     General: No scleral  icterus.    Extraocular Movements: Extraocular movements intact.  Cardiovascular:     Rate and Rhythm: Normal rate and regular rhythm.     Heart sounds: Normal heart sounds. No murmur heard.    No friction rub. No gallop.  Pulmonary:     Effort: Pulmonary effort is normal. No respiratory distress.     Breath sounds: Normal breath sounds. No wheezing, rhonchi or rales.  Abdominal:     General: Bowel sounds are normal. There is no distension.     Palpations: Abdomen is soft.     Tenderness: There is no abdominal tenderness. There is no guarding or rebound.  Musculoskeletal:     Cervical back: Neck supple.     Right lower leg: No edema.     Left lower leg: No edema.  Skin:    General: Skin is warm and dry.     Coloration: Skin is not jaundiced or pale.  Neurological:     General: No focal deficit present.     Mental Status: He is alert and oriented to person, place, and time. Mental status is at baseline.  Psychiatric:        Mood and Affect: Mood normal.        Behavior: Behavior normal.        Thought Content: Thought content normal.        Judgment: Judgment normal.      Assessment:  Randall Thomas is a 38 y.o. male  who presents today for Esophagogastroduodenoscopy for epigastric pain, gerd .  Plan:  Esophagogastroduodenoscopy with possible intervention today  Esophagogastroduodenoscopy with possible biopsy, control of bleeding, polypectomy, and interventions as necessary has been discussed with the patient/patient representative. Informed consent was obtained from the patient/patient representative after explaining the indication, nature, and risks of the procedure including but not limited to death, bleeding, perforation, missed neoplasm/lesions, cardiorespiratory compromise, and reaction to medications. Opportunity for questions was given and appropriate answers were provided. Patient/patient representative has verbalized understanding is amenable to  undergoing the procedure.   [name], DO  [facility] Gastroenterology  Portions of the record may have been created with voice recognition software. Occasional wrong-word or 'sound-a-like' substitutions may have occurred due to the inherent limitations of voice recognition software.  Read the chart carefully and recognize, using context, where substitutions may have occurred.

## 2023-09-06 NOTE — Anesthesia Preprocedure Evaluation (Addendum)
 Anesthesia Evaluation  Patient identified by MRN, date of birth, ID band Patient awake    Reviewed: Allergy & Precautions, NPO status , Patient's Chart, lab work & pertinent test results  History of Anesthesia Complications Negative for: history of anesthetic complications  Airway Mallampati: IV   Neck ROM: Full    Dental  (+) Implants   Pulmonary neg pulmonary ROS   Pulmonary exam normal breath sounds clear to auscultation       Cardiovascular Exercise Tolerance: Good Normal cardiovascular exam Rhythm:Regular Rate:Normal  ECG 11/03/22:  Sinus tachycardia  Possible Left atrial enlargement  Incomplete right bundle branch block  Cannot rule out Inferior infarct , age undetermined     Neuro/Psych negative neurological ROS     GI/Hepatic ,GERD  ,,  Endo/Other  negative endocrine ROS    Renal/GU negative Renal ROS     Musculoskeletal   Abdominal   Peds  Hematology negative hematology ROS (+)   Anesthesia Other Findings   Reproductive/Obstetrics                              Anesthesia Physical Anesthesia Plan  ASA: 2  Anesthesia Plan: General   Post-op Pain Management:    Induction: Intravenous  PONV Risk Score and Plan: 2 and Propofol  infusion, TIVA and Treatment may vary due to age or medical condition  Airway Management Planned: Natural Airway  Additional Equipment:   Intra-op Plan:   Post-operative Plan:   Informed Consent: I have reviewed the patients History and Physical, chart, labs and discussed the procedure including the risks, benefits and alternatives for the proposed anesthesia with the patient or authorized representative who has indicated his/her understanding and acceptance.       Plan Discussed with: CRNA  Anesthesia Plan Comments: (History and consent obtained in Spanish. LMA/GETA backup discussed.  Patient consented for risks of anesthesia including  but not limited to:  - adverse reactions to medications - damage to eyes, teeth, lips or other oral mucosa - nerve damage due to positioning  - sore throat or hoarseness - damage to heart, brain, nerves, lungs, other parts of body or loss of life  Informed patient about role of CRNA in peri- and intra-operative care.  Patient voiced understanding.)         Anesthesia Quick Evaluation

## 2023-09-07 ENCOUNTER — Encounter: Payer: Self-pay | Admitting: Gastroenterology

## 2023-09-07 LAB — SURGICAL PATHOLOGY

## 2023-09-13 ENCOUNTER — Ambulatory Visit: Admission: RE | Admit: 2023-09-13 | Source: Ambulatory Visit

## 2023-09-26 ENCOUNTER — Ambulatory Visit
Admission: RE | Admit: 2023-09-26 | Discharge: 2023-09-26 | Disposition: A | Discharge status: Home / Self Care | Payer: Self-pay | Source: Ambulatory Visit | Attending: Gastroenterology | Admitting: Gastroenterology

## 2023-09-26 DIAGNOSIS — K8020 Calculus of gallbladder without cholecystitis without obstruction: Secondary | ICD-10-CM | POA: Insufficient documentation

## 2023-09-26 DIAGNOSIS — K219 Gastro-esophageal reflux disease without esophagitis: Secondary | ICD-10-CM | POA: Insufficient documentation

## 2023-09-26 DIAGNOSIS — R1013 Epigastric pain: Secondary | ICD-10-CM | POA: Insufficient documentation

## 2023-09-26 MED ORDER — TECHNETIUM TC 99M MEBROFENIN IV KIT
5.0000 | PACK | Freq: Once | INTRAVENOUS | Status: AC | PRN
  Administered 2023-09-26 (×2): 5.34 via INTRAVENOUS
